# Patient Record
Sex: Female | Born: 1945 | Marital: Single | Smoking: Never smoker
Health system: Western US, Academic
[De-identification: ages and names within clinical notes are randomized; demographics above are authoritative.]

## PROBLEM LIST (undated history)

## (undated) DIAGNOSIS — N3941 Urge incontinence: Secondary | ICD-10-CM

## (undated) DIAGNOSIS — I8393 Asymptomatic varicose veins of bilateral lower extremities: Secondary | ICD-10-CM

## (undated) DIAGNOSIS — I82409 Acute embolism and thrombosis of unspecified deep veins of unspecified lower extremity: Secondary | ICD-10-CM

## (undated) DIAGNOSIS — E039 Hypothyroidism, unspecified: Secondary | ICD-10-CM

## (undated) DIAGNOSIS — D0461 Carcinoma in situ of skin of right upper limb, including shoulder: Secondary | ICD-10-CM

## (undated) DIAGNOSIS — N952 Postmenopausal atrophic vaginitis: Secondary | ICD-10-CM

## (undated) DIAGNOSIS — H9192 Unspecified hearing loss, left ear: Secondary | ICD-10-CM

## (undated) DIAGNOSIS — K644 Residual hemorrhoidal skin tags: Secondary | ICD-10-CM

## (undated) DIAGNOSIS — M1712 Unilateral primary osteoarthritis, left knee: Secondary | ICD-10-CM

## (undated) DIAGNOSIS — H3553 Other dystrophies primarily involving the sensory retina: Secondary | ICD-10-CM

## (undated) DIAGNOSIS — E785 Hyperlipidemia, unspecified: Secondary | ICD-10-CM

## (undated) DIAGNOSIS — H8110 Benign paroxysmal vertigo, unspecified ear: Secondary | ICD-10-CM

## (undated) DIAGNOSIS — Z86711 Personal history of pulmonary embolism: Secondary | ICD-10-CM

## (undated) DIAGNOSIS — H548 Legal blindness, as defined in USA: Secondary | ICD-10-CM

## (undated) HISTORY — DX: Other dystrophies primarily involving the sensory retina: H35.53

## (undated) HISTORY — DX: Hypothyroidism, unspecified: E03.9

## (undated) HISTORY — DX: Postmenopausal atrophic vaginitis: N95.2

## (undated) HISTORY — DX: Acute embolism and thrombosis of unspecified deep veins of unspecified lower extremity: I82.409

## (undated) HISTORY — DX: Unspecified hearing loss, left ear: H91.92

## (undated) HISTORY — DX: Residual hemorrhoidal skin tags: K64.4

## (undated) HISTORY — DX: Urge incontinence: N39.41

## (undated) HISTORY — DX: Unilateral primary osteoarthritis, left knee: M17.12

## (undated) HISTORY — DX: Asymptomatic varicose veins of bilateral lower extremities: I83.93

## (undated) HISTORY — DX: Carcinoma in situ of skin of right upper limb, including shoulder: D04.61

## (undated) HISTORY — DX: Benign paroxysmal vertigo, unspecified ear: H81.10

## (undated) HISTORY — DX: Hyperlipidemia, unspecified: E78.5

## (undated) HISTORY — PX: APPENDECTOMY: SHX54

## (undated) HISTORY — PX: BACK SURGERY: SHX140

## (undated) HISTORY — PX: MENISCECTOMY: SHX123

## (undated) NOTE — Telephone Encounter (Signed)
Formatting of this note might be different from the original.  Images from the original note were not included.  Referral was declined by [employer]. Advise how you would like to proceed.     Candelaria Stagers.   [facility] Referral Management Specialist  Phone: [phone]  Electronically signed by Kandis Cocking at 10/03/2021  6:50 AM PDT

## (undated) NOTE — Telephone Encounter (Signed)
Formatting of this note might be different from the original.  Called patient and left voicemail requesting a call back.     PSC rep: please route caller back to the [facility] Triage Line when they call back. Thank you.    Electronically signed by Salome Holmes, RN at 10/03/2021 12:06 PM PDT

## (undated) NOTE — Telephone Encounter (Signed)
Formatting of this note might be different from the original.  Has not been redirected please assist   Electronically signed by Shirley Muscat, MA at 10/02/2021  9:43 AM PDT

## (undated) NOTE — Telephone Encounter (Signed)
Formatting of this note might be different from the original.  Message sent to [facility] Ortho schedulers to assist w/ redirecting referral internally.     Candelaria Stagers.   SMF Referral Management Specialist  Phone: [phone]  Electronically signed by Kandis Cocking at 09/05/2021  8:52 AM PDT

## (undated) NOTE — Telephone Encounter (Signed)
Formatting of this note might be different from the original.  ENCOUNTER UPDATE    SPECIFIC ACTION/QUESTION REQUESTED: (What/When/Why?: Patient returned call, transferring to nurse line.)  CALLER'S NAME/RELATION: Self  PHONE NUMBER: [phone]  MESSAGE RELAYED? not applicable    Electronically signed by Nada Maclachlan E at 10/09/2021  8:49 AM PDT

## (undated) NOTE — Telephone Encounter (Signed)
Formatting of this note might be different from the original.  Relayed message to patient.   She verbalized understanding.   Electronically signed by Salome Holmes, RN at 10/09/2021  8:56 AM PDT

## (undated) NOTE — Telephone Encounter (Signed)
Formatting of this note might be different from the original.  Please call patient. She has been seen by Dr. Hale Bogus 07/11/2021. I recommend that schedule a follow up appointment with him as it looks like referral to and ortho in [place] have been declined. She can ask Dr. Hale Bogus for referral for a second opinion. It might be authorized if it comes from him. Thank you.   Electronically signed by Elicia Lamp, MD at 10/03/2021 11:44 AM PDT

## (undated) NOTE — Telephone Encounter (Signed)
Formatting of this note might be different from the original.  Called patient and left voicemail requesting a call back.     PSC rep: please route caller back to the [facility] Triage Line when they call back. Thank you.    Electronically signed by Salome Holmes, RN at 10/08/2021  2:02 PM PDT

## (undated) NOTE — Telephone Encounter (Signed)
Formatting of this note might be different from the original.  Routing message to PCP's office for review     Vannessa Z.   SM[facility] Referral Management Specialist  Phone: [phone]  Electronically signed by Kandis Cocking at 09/04/2021  9:06 AM PDT

## (undated) NOTE — Telephone Encounter (Signed)
Formatting of this note might be different from the original.  Patient states she was told by [place] to call back in 6 months   She is willing to travel for sooner appointment whether second opinion with [facility] [place] or [employer] to lvm for pt   Electronically signed by Shirley Muscat, MA at 09/04/2021  9:43 AM PDT

## (undated) NOTE — Telephone Encounter (Signed)
Formatting of this note might be different from the original.  Received message from Ortho that they are unable to accommodate at this time. I am not sure which Ortho office this is. Please assist in redirecting where appropriate internally with [facility].   Electronically signed by Ellin Saba, MD at 09/19/2021 12:23 AM PDT

## (undated) NOTE — Telephone Encounter (Signed)
Formatting of this note might be different from the original.  Please ask referrals to redirect ortho referral from 07/25/2021 to [facility]. Thank you.  Electronically signed by Elicia Lamp, MD at 09/04/2021  9:45 AM PDT

## (undated) NOTE — Telephone Encounter (Signed)
Formatting of this note might be different from the original.  Please follow up on this as I can't tell that it has been addressed.   Electronically signed by Ellin Saba, MD at 09/30/2021  9:17 PM PDT

## (undated) NOTE — Telephone Encounter (Signed)
Formatting of this note might be different from the original.  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  Yes    Specialty Requested (if known):  [Orthopedic Surgeon]  Provider Requested (full name):  N/A                      Phone:  N/A        Address:  [address] [place]]    Additional Comments: PCP sent referral to [facility] Department of Orthopedic Surgery) Patient was informed to call back in 6 months. Patient stated she cant wait that long to get assistance. Patient would like to get a return call about this referral and preference of physician. Please contact patient as soon as possible.    Appointment Scheduled: NO    Respond to Patient By:  Telephone Call [phone]    Electronically signed by Chaney Born at 09/03/2021  4:52 PM PDT

---

## 1970-05-22 HISTORY — PX: APPENDECTOMY: SHX000030

## 2000-03-23 DIAGNOSIS — I2699 Other pulmonary embolism without acute cor pulmonale: Secondary | ICD-10-CM

## 2000-03-23 HISTORY — PX: PR REMOVE PUL ART EMBOLI W CP BYPASS: 33910

## 2000-03-23 HISTORY — DX: Other pulmonary embolism without acute cor pulmonale: I26.99

## 2000-03-23 HISTORY — PX: OTHER SURGICAL HISTORY: SHX170

## 2011-03-24 HISTORY — PX: OTHER SURGICAL HISTORY: SHX170

## 2012-07-11 ENCOUNTER — Ambulatory Visit: Payer: MEDICARE

## 2012-07-12 ENCOUNTER — Encounter: Payer: Self-pay | Admitting: Ophthalmology

## 2012-08-08 ENCOUNTER — Ambulatory Visit: Payer: Self-pay

## 2012-08-11 ENCOUNTER — Ambulatory Visit: Admitting: Ophthalmology

## 2012-08-11 NOTE — Procedures (Signed)
Macula OCT and Fundus Photos OU performed. Please review.  CMT OD: 287 microns.  CMT OS: 286 microns.    Saide Lanuza, CRA.

## 2012-08-11 NOTE — Progress Notes (Addendum)
[facility]  VITREORETINAL SERVICE  NEW PATIENT ATTENDING NOTE    Referring doctor: self    Lori Lewis is a 67yr old female who presents for evaluation of poor vision in both eyes starting at age 67 or so when she failed a school eye exam. She was taken to her local eye doctor and was eventually diagnosed with Stargardt disease at [facility].     Past Ocular History:  Stargardt disease    Current Eye Medications:  none    Pertinent Past Medical History:  hypothyroidism    Family History:  No family history of age-related macular degeneration, retinal tears or detachment.  No affected family members, 3 sibs, 2 daughters, none affected.  Social History:  Denies smoking  Works     See exam template    Macula OCT and Fundus Photos OU performed 08/11/2012   CMT OD: 287 microns. Thinning and atrophy, foveal contour preserved, no fluid  CMT OS: 286 microns. Thinning and atrophy, foveal contour preserved, no fluid    Fundus photos 08/11/2012 show foveal hyperpigmentation and diffuse macular atrophy in both eyes.    Impression:  1. End stage Stargardt disease   - s/p experimental laser treatments at [facility] in early 1970's in the left eye only.   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 Dry eye syndrome    3. Cataracts   - observe      Plan:    - cautious observation  - warm compresses/ATs  - Follow-up in 1 year ocular coherence tomography OU    Chelsea Pedretti, MD PhD  [facility]   Retina Attending

## 2012-08-11 NOTE — Progress Notes (Signed)
POHx- macular degeneration (Stargard's Ds) since 1959                Laser for macular degeration 1971 in [place]

## 2013-02-23 HISTORY — PX: COLONOSCOPY: GILAB00002

## 2017-03-07 ENCOUNTER — Encounter: Payer: Self-pay | Admitting: Emergency Medicine

## 2017-03-07 ENCOUNTER — Other Ambulatory Visit: Payer: Self-pay

## 2017-03-07 ENCOUNTER — Observation Stay
Admission: EM | Admit: 2017-03-07 | Discharge: 2017-03-10 | Disposition: A | Discharge status: Home / Self Care | Payer: Medicare Other | Attending: Internal Medicine | Admitting: Internal Medicine

## 2017-03-07 ENCOUNTER — Observation Stay: Payer: Medicare Other

## 2017-03-07 DIAGNOSIS — R42 Dizziness and giddiness: Secondary | ICD-10-CM | POA: Diagnosis not present

## 2017-03-07 DIAGNOSIS — R112 Nausea with vomiting, unspecified: Secondary | ICD-10-CM

## 2017-03-07 DIAGNOSIS — R51 Headache: Secondary | ICD-10-CM | POA: Insufficient documentation

## 2017-03-07 DIAGNOSIS — E039 Hypothyroidism, unspecified: Secondary | ICD-10-CM | POA: Diagnosis not present

## 2017-03-07 DIAGNOSIS — R531 Weakness: Secondary | ICD-10-CM | POA: Diagnosis present

## 2017-03-07 DIAGNOSIS — Z79899 Other long term (current) drug therapy: Secondary | ICD-10-CM | POA: Diagnosis not present

## 2017-03-07 DIAGNOSIS — Z880 Allergy status to penicillin: Secondary | ICD-10-CM | POA: Insufficient documentation

## 2017-03-07 DIAGNOSIS — E876 Hypokalemia: Secondary | ICD-10-CM | POA: Diagnosis present

## 2017-03-07 DIAGNOSIS — Z86711 Personal history of pulmonary embolism: Secondary | ICD-10-CM | POA: Diagnosis not present

## 2017-03-07 DIAGNOSIS — R2689 Other abnormalities of gait and mobility: Secondary | ICD-10-CM | POA: Insufficient documentation

## 2017-03-07 DIAGNOSIS — Z7901 Long term (current) use of anticoagulants: Secondary | ICD-10-CM | POA: Diagnosis not present

## 2017-03-07 DIAGNOSIS — H548 Legal blindness, as defined in USA: Secondary | ICD-10-CM | POA: Diagnosis not present

## 2017-03-07 HISTORY — DX: Legal blindness, as defined in USA: H54.8

## 2017-03-07 HISTORY — DX: Personal history of pulmonary embolism: Z86.711

## 2017-03-07 HISTORY — DX: Hypothyroidism, unspecified: E03.9

## 2017-03-07 LAB — URINALYSIS, COMPLETE (UACMP) WITH MICROSCOPIC
BILIRUBIN URINE: NEGATIVE
Bacteria, UA: NONE SEEN
GLUCOSE, UA: NEGATIVE mg/dL
KETONES UR: NEGATIVE mg/dL
LEUKOCYTES UA: NEGATIVE
NITRITE: NEGATIVE
PH: 7 (ref 5.0–8.0)
PROTEIN: NEGATIVE mg/dL
Specific Gravity, Urine: 1.01 (ref 1.005–1.030)

## 2017-03-07 LAB — COMPREHENSIVE METABOLIC PANEL
ALBUMIN: 3.8 g/dL (ref 3.5–5.0)
ALT: 16 U/L (ref 14–54)
ANION GAP: 11 (ref 5–15)
AST: 30 U/L (ref 15–41)
Alkaline Phosphatase: 68 U/L (ref 38–126)
BUN: 13 mg/dL (ref 6–20)
CHLORIDE: 103 mmol/L (ref 101–111)
CO2: 26 mmol/L (ref 22–32)
Calcium: 8.8 mg/dL — ABNORMAL LOW (ref 8.9–10.3)
Creatinine, Ser: 0.65 mg/dL (ref 0.44–1.00)
GFR calc Af Amer: >60 mL/min (ref >60)
GLUCOSE: 134 mg/dL — AB (ref 65–99)
POTASSIUM: 3.1 mmol/L — AB (ref 3.5–5.1)
Sodium: 140 mmol/L (ref 135–145)
Total Bilirubin: 0.7 mg/dL (ref 0.3–1.2)
Total Protein: 6.5 g/dL (ref 6.5–8.1)

## 2017-03-07 LAB — CBC WITH DIFFERENTIAL/PLATELET
BASOS ABS: 0 10*3/uL (ref 0–0.1)
Basophils Relative: 0 %
EOS ABS: 0.1 10*3/uL (ref 0–0.7)
Eosinophils Relative: 1 %
HCT: 42.6 % (ref 35.0–47.0)
HEMOGLOBIN: 14.1 g/dL (ref 12.0–16.0)
LYMPHS ABS: 1.3 10*3/uL (ref 1.0–3.6)
Lymphocytes Relative: 12 %
MCH: 29.1 pg (ref 26.0–34.0)
MCHC: 33.1 g/dL (ref 32.0–36.0)
MCV: 87.8 fL (ref 80.0–100.0)
Monocytes Absolute: 0.5 10*3/uL (ref 0.2–0.9)
Monocytes Relative: 5 %
NEUTROS PCT: 82 %
Neutro Abs: 8.8 10*3/uL — ABNORMAL HIGH (ref 1.4–6.5)
PLATELETS: 201 10*3/uL (ref 150–440)
RBC: 4.86 MIL/uL (ref 3.80–5.20)
RDW: 13.2 % (ref 11.5–14.5)
WBC: 10.8 10*3/uL (ref 3.6–11.0)

## 2017-03-07 LAB — TROPONIN I

## 2017-03-07 LAB — LACTIC ACID, PLASMA
LACTIC ACID, VENOUS: 1.3 mmol/L (ref 0.5–1.9)
Lactic Acid, Venous: 2 mmol/L (ref 0.5–1.9)

## 2017-03-07 LAB — MAGNESIUM: Magnesium: 1.9 mg/dL (ref 1.7–2.4)

## 2017-03-07 LAB — TSH: TSH: 0.836 u[IU]/mL (ref 0.350–4.500)

## 2017-03-07 LAB — PHOSPHORUS: PHOSPHORUS: 3.2 mg/dL (ref 2.5–4.6)

## 2017-03-07 MED ORDER — ACETAMINOPHEN 500 MG PO TABS
1000.0000 mg | ORAL_TABLET | Freq: Once | ORAL | Status: AC
  Administered 2017-03-07: 1000 mg via ORAL
  Filled 2017-03-07: qty 2

## 2017-03-07 MED ORDER — ONDANSETRON HCL 4 MG PO TABS: 4.0000 mg | ORAL_TABLET | Freq: Three times a day (TID) | ORAL | 0 refills | Status: DC | PRN

## 2017-03-07 MED ORDER — ACETAMINOPHEN 650 MG RE SUPP: 650.0000 mg | Freq: Four times a day (QID) | RECTAL | Status: DC | PRN

## 2017-03-07 MED ORDER — POTASSIUM CHLORIDE CRYS ER 20 MEQ PO TBCR
40.0000 meq | EXTENDED_RELEASE_TABLET | Freq: Once | ORAL | Status: AC
  Administered 2017-03-07: 40 meq via ORAL
  Filled 2017-03-07: qty 2

## 2017-03-07 MED ORDER — MECLIZINE HCL 25 MG PO TABS
25.0000 mg | ORAL_TABLET | Freq: Three times a day (TID) | ORAL | Status: DC | PRN
  Administered 2017-03-08: 25 mg via ORAL
  Filled 2017-03-07 (×2): qty 1

## 2017-03-07 MED ORDER — ENOXAPARIN SODIUM 40 MG/0.4ML ~~LOC~~ SOLN
40.0000 mg | SUBCUTANEOUS | Status: DC
  Administered 2017-03-07 – 2017-03-09 (×3): 40 mg via SUBCUTANEOUS
  Filled 2017-03-07 (×3): qty 0.4

## 2017-03-07 MED ORDER — SODIUM CHLORIDE 0.9 % IV BOLUS (SEPSIS)
1000.0000 mL | Freq: Once | INTRAVENOUS | Status: AC
  Administered 2017-03-07: 1000 mL via INTRAVENOUS

## 2017-03-07 MED ORDER — LORAZEPAM 2 MG/ML IJ SOLN
2.0000 mg | Freq: Once | INTRAMUSCULAR | Status: AC | PRN
  Administered 2017-03-08: 2 mg via INTRAVENOUS
  Filled 2017-03-07: qty 1

## 2017-03-07 MED ORDER — ONDANSETRON HCL 4 MG/2ML IJ SOLN
4.0000 mg | Freq: Once | INTRAMUSCULAR | Status: AC
  Administered 2017-03-07: 4 mg via INTRAVENOUS
  Filled 2017-03-07: qty 2

## 2017-03-07 MED ORDER — ACETAMINOPHEN 325 MG PO TABS
650.0000 mg | ORAL_TABLET | Freq: Four times a day (QID) | ORAL | Status: DC | PRN
  Administered 2017-03-08 – 2017-03-10 (×5): 650 mg via ORAL
  Filled 2017-03-07 (×5): qty 2

## 2017-03-07 MED ORDER — POTASSIUM CHLORIDE IN NACL 20-0.9 MEQ/L-% IV SOLN
INTRAVENOUS | Status: DC
  Administered 2017-03-07 – 2017-03-09 (×4): via INTRAVENOUS
  Filled 2017-03-07 (×5): qty 1000

## 2017-03-07 MED ORDER — ONDANSETRON HCL 4 MG/2ML IJ SOLN
4.0000 mg | Freq: Four times a day (QID) | INTRAMUSCULAR | Status: AC
  Administered 2017-03-07 – 2017-03-09 (×8): 4 mg via INTRAVENOUS
  Filled 2017-03-07 (×8): qty 2

## 2017-03-07 MED ORDER — LEVOTHYROXINE SODIUM 112 MCG PO TABS
112.0000 ug | ORAL_TABLET | Freq: Every day | ORAL | Status: DC
  Administered 2017-03-08 – 2017-03-10 (×3): 112 ug via ORAL
  Filled 2017-03-07 (×3): qty 1

## 2017-03-07 MED ORDER — DIAZEPAM 2 MG PO TABS
2.0000 mg | ORAL_TABLET | Freq: Once | ORAL | Status: AC
  Administered 2017-03-07: 2 mg via ORAL
  Filled 2017-03-07: qty 1

## 2017-03-07 MED ORDER — MECLIZINE HCL 25 MG PO TABS
25.0000 mg | ORAL_TABLET | ORAL | Status: AC
  Administered 2017-03-07: 25 mg via ORAL
  Filled 2017-03-07: qty 1

## 2017-03-07 MED ORDER — PROMETHAZINE HCL 25 MG/ML IJ SOLN: 12.5000 mg | Freq: Four times a day (QID) | INTRAMUSCULAR | Status: DC | PRN

## 2017-03-07 NOTE — H&P (Signed)
[facility] [place] [facility]   PATIENT NAME: Andrea Spence    MR#:  [id number]  DATE OF BIRTH:  20-Jul-1945  DATE OF ADMISSION:  03/07/2017  PRIMARY CARE PHYSICIAN: System, Pcp Not In   REQUESTING/REFERRING PHYSICIAN: Dr. Governor Rooks  CHIEF COMPLAINT:   Chief Complaint  Patient presents with  . Emesis    HISTORY OF PRESENT ILLNESS:  Andrea Spence  is a 71 y.o. female with a known history of early-onset macular degeneration with legal blindness, hypothyroidism and remote history of pulmonary embolism presents to hospital secondary to intractable nausea, vomiting and dizziness. Patient is usually very active at baseline. She is actually traveling from [place]. Yesterday afternoon she and her friend had a tuna salad on a biscuit, did not start having her symptoms up until late yesterday. Nobody else with similar symptoms. Denies any fevers or chills. Started with intractable nausea associated with significant vomiting every 15 minutes almost. No bloody vomiting. Complains mostly of brownish and bilious vomiting. Denies any abdominal pain or diarrhea. Also started to have extensive dizziness this morning. She got to the hospital, labs were okay, IV fluids were given, her potassium was low which was replaced. She was stood up to get orthostatic vitals and her dizziness came back again and she feels debilitated. She is being admitted under observation for the same.  PAST MEDICAL HISTORY:   Past Medical History:  Diagnosis Date  . History of pulmonary embolism   . Hypothyroid   . Legal blindness     PAST SURGICAL HISTORY:   Past Surgical History:  Procedure Laterality Date  . APPENDECTOMY    . BACK SURGERY    . MENISCECTOMY Left     SOCIAL HISTORY:   Social History   Tobacco Use  . Smoking status: Never Smoker  . Smokeless tobacco: Never Used  Substance Use Topics  . Alcohol use: Yes    Comment: occasional wine    FAMILY HISTORY:    Family History  Problem Relation Age of Onset  . CAD Mother   . CAD Father   . Stroke Brother     DRUG ALLERGIES:   Allergies  Allergen Reactions  . Stinging Nettle [Urtica Dioica] Anaphylaxis  . Amoxicillin Rash    Has patient had a PCN reaction causing immediate rash, facial/tongue/throat swelling, SOB or lightheadedness with hypotension: Unknown Has patient had a PCN reaction causing severe rash involving mucus membranes or skin necrosis: Unknown Has patient had a PCN reaction that required hospitalization: Unknown Has patient had a PCN reaction occurring within the last 10 years: Unknown If all of the above answers are "NO", then may proceed with Cephalosporin use.  Marland Kitchen Penicillins Rash    Has patient had a PCN reaction causing immediate rash, facial/tongue/throat swelling, SOB or lightheadedness with hypotension: Yes Has patient had a PCN reaction causing severe rash involving mucus membranes or skin necrosis: No Has patient had a PCN reaction that required hospitalization: No Has patient had a PCN reaction occurring within the last 10 years: Unknown If all of the above answers are "NO", then may proceed with Cephalosporin use.   . Sulfa Antibiotics Rash    REVIEW OF SYSTEMS:   Review of Systems  Constitutional: Positive for malaise/fatigue. Negative for chills, fever and weight loss.  HENT: Negative for ear discharge, ear pain, hearing loss, nosebleeds and tinnitus.   Eyes: Negative for blurred vision, double vision and photophobia.  Respiratory: Negative for cough, hemoptysis, shortness of breath and wheezing.  Cardiovascular: Negative for chest pain, palpitations, orthopnea and leg swelling.  Gastrointestinal: Positive for nausea and vomiting. Negative for abdominal pain, constipation, diarrhea, heartburn and melena.  Genitourinary: Negative for dysuria, frequency, hematuria and urgency.  Musculoskeletal: Negative for back pain, myalgias and neck pain.  Skin:  Negative for rash.  Neurological: Positive for dizziness. Negative for tingling, tremors, sensory change, speech change, focal weakness and headaches.  Endo/Heme/Allergies: Does not bruise/bleed easily.  Psychiatric/Behavioral: Negative for depression.    MEDICATIONS AT HOME:   Prior to Admission medications   Medication Sig Start Date End Date Taking? Authorizing Provider  levothyroxine (SYNTHROID, LEVOTHROID) 112 MCG tablet Take 112 mcg by mouth daily. 02/25/17  Yes [provider]  ondansetron (ZOFRAN) 4 MG tablet Take 1 tablet (4 mg total) by mouth every 8 (eight) hours as needed for nausea or vomiting. 03/07/17   Governor RooksLord, Rebecca, MD      VITAL SIGNS:  Blood pressure (!) 151/77, pulse 60, temperature (!) 97.5 F (36.4 C), temperature source Oral, resp. rate 20, height 5\' 6"  (1.676 m), weight 68 kg (150 lb), SpO2 99 %.  PHYSICAL EXAMINATION:   Physical Exam  GENERAL:  71 y.o.-year-old elderly patient lying in the bed with no acute distress.  EYES: Pupils equal, round, reactive to light and accommodation. No scleral icterus. Extraocular muscles intact.  HEENT: Head atraumatic, normocephalic. Oropharynx and nasopharynx clear.  NECK:  Supple, no jugular venous distention. No thyroid enlargement, no tenderness.  LUNGS: Normal breath sounds bilaterally, no wheezing, rales,rhonchi or crepitation. No use of accessory muscles of respiration.  CARDIOVASCULAR: S1, S2 normal. No murmurs, rubs, or gallops.  ABDOMEN: Soft, nontender, nondistended. Bowel sounds present. No organomegaly or mass.  EXTREMITIES: No pedal edema, cyanosis, or clubbing.  NEUROLOGIC: Cranial nerves II through XII are intact. Muscle strength 5/5 in all extremities. Sensation intact. Gait not checked.  PSYCHIATRIC: The patient is alert and oriented x 3.  SKIN: No obvious rash, lesion, or ulcer.   LABORATORY PANEL:   CBC Recent Labs  Lab 03/07/17 1017  WBC 10.8  HGB 14.1  HCT 42.6  PLT 201    ------------------------------------------------------------------------------------------------------------------  Chemistries  Recent Labs  Lab 03/07/17 1017  NA 140  K 3.1*  CL 103  CO2 26  GLUCOSE 134*  BUN 13  CREATININE 0.65  CALCIUM 8.8*  AST 30  ALT 16  ALKPHOS 68  BILITOT 0.7   ------------------------------------------------------------------------------------------------------------------  Cardiac Enzymes Recent Labs  Lab 03/07/17 1017  TROPONINI <0.03   ------------------------------------------------------------------------------------------------------------------  RADIOLOGY:  No results found.  EKG:   Orders placed or performed during the hospital encounter of 03/07/17  . EKG 12-Lead  . EKG 12-Lead  . ED EKG  . ED EKG    IMPRESSION AND PLAN:   Herbie SaxonKaren Ybarbo  is a 71 y.o. female with a known history of early-onset macular degeneration with legal blindness, hypothyroidism and remote history of pulmonary embolism presents to hospital secondary to intractable nausea, vomiting and dizziness.  1. Nausea vomiting-sounds like acute gastroenteritis. -IV fluids, IV Protonix and started on clear liquid diet and monitor. -No abdominal pain or diarrhea. So we'll hold off on antibiotics her CT abdomen at this time.  2. Dizziness-could be from dehydration. -However will get a CT head to rule out any intracranial causes. -Meclizine when necessary. If needed, will also add Valium. -Try to ambulate tomorrow.  3. Hypokalemia-being replaced. Secondary to GI losses. Check magnesium level  4. Hypothyroidism-continue Synthroid. Check TSH  5. DVT prophylaxis on Lovenox  All the records are reviewed and case discussed with ED provider. Management plans discussed with the patient, family and they are in agreement.  CODE STATUS: Full code  TOTAL TIME TAKING CARE OF THIS PATIENT: 50 minutes.    Enid BaasKALISETTI,Olly Shiner M.D on 03/07/2017 at 3:13 PM  Between 7am  to 6pm - Pager - [phone]  After 6pm go to [email or web address] - password [email or web address]  [facility]  Office  [phone]  CC: Primary care physician; System, Pcp Not In

## 2017-03-07 NOTE — Progress Notes (Signed)
Patient still with persistent dizziness, now concern is if its central dizziness. No nystagmus noted on exam. Since there than nausea/vomiting- no other signs of GI illness seen- so it could be central cause of dizziness with nausea/vomiting. CT head results discussed with patient. Meclizine prn and 1 dose of valium ordered. MRI brain ordered- patient wants to do it in AM- ativan ordered. If positive- please consult neurology

## 2017-03-07 NOTE — ED Notes (Signed)
AAOx3.  Skin warm and dry.  NAD 

## 2017-03-07 NOTE — ED Provider Notes (Signed)
[facility] Emergency Department Provider Note ____________________________________________   I have reviewed the triage vital signs and the triage nursing note.  HISTORY  Chief Complaint Emesis   Historian Patient, patient's friend with whom she is staying here, as well as her daughter by phone  HPI Andrea Spence is a 71 y.o. female who is been relatively healthy, is on a multi leg travel trip, and had been feeling fine yesterday had the same thing to eat as her friend with whom she is staying, and this morning around 9 AM she felt nauseated and had several episodes of nonbloody emesis.  No diarrhea.  No fevers.  She received nausea medication by EMS and is feeling a little better than she was prior to EMS picking her up.  On this past Monday, almost a week ago she was in [facility] [place] in sewage [place].  She had a history of treated malaria years ago.  She has a history of previous blood clots, but reports no shortness of breath, chest pain, or leg edema.  She is no longer on anticoagulation other than aspirin.   Past Medical History:  Diagnosis Date  . Hypothyroid     There are no active problems to display for this patient.   Past Surgical History:  Procedure Laterality Date  . APPENDECTOMY      Prior to Admission medications   Medication Sig Start Date End Date Taking? Authorizing Provider  ondansetron (ZOFRAN) 4 MG tablet Take 1 tablet (4 mg total) by mouth every 8 (eight) hours as needed for nausea or vomiting. 03/07/17   Governor Rooks, MD    Allergies  Allergen Reactions  . Amoxicillin   . Penicillins   . Sulfa Antibiotics     No family history on file.  Social History Social History   Tobacco Use  . Smoking status: Never Smoker  . Smokeless tobacco: Never Used  Substance Use Topics  . Alcohol use: Yes  . Drug use: Not on file    Review of  Systems  Constitutional: Negative for fever. Eyes: Negative for visual changes. ENT: Negative for sore throat. Cardiovascular: Negative for chest pain. Respiratory: Negative for shortness of breath. Gastrointestinal: Negative for diarrhea. Genitourinary: Negative for dysuria. Musculoskeletal: Negative for back pain. Skin: Negative for rash. Neurological: Negative for headache.  ____________________________________________   PHYSICAL EXAM:  VITAL SIGNS: ED Triage Vitals  Enc Vitals Group     BP 03/07/17 0936 (!) 151/77     Pulse Rate 03/07/17 0936 60     Resp 03/07/17 0936 20     Temp 03/07/17 0945 (!) 97.5 F (36.4 C)     Temp Source 03/07/17 0945 Oral     SpO2 03/07/17 0936 99 %     Weight 03/07/17 0936 150 lb (68 kg)     Height 03/07/17 0936 5\' 6"  (1.676 m)     Head Circumference --      Peak Flow --      Pain Score 03/07/17 0935 0     Pain Loc --      Pain Edu? --      Excl. in GC? --      Constitutional: Alert and oriented.  She looks puny like she does not feel well, but she is able to answer some questions and is alert. HEENT   Head: Normocephalic and atraumatic.      Eyes: Conjunctivae are normal. Pupils equal and round.  Ears:         Nose: No congestion/rhinnorhea.   Mouth/Throat: Mucous membranes are moderately dry.   Neck: No stridor. Cardiovascular/Chest: Normal rate, regular rhythm.  No murmurs, rubs, or gallops. Respiratory: Normal respiratory effort without tachypnea nor retractions. Breath sounds are clear and equal bilaterally. No wheezes/rales/rhonchi. Gastrointestinal: Soft. No distention, no guarding, no rebound. Nontender.    Genitourinary/rectal:Deferred Musculoskeletal: Nontender with normal range of motion in all extremities. No joint effusions.  No lower extremity tenderness.  No edema. Neurologic:  Normal speech and language. No gross or focal neurologic deficits are appreciated. Skin:  Skin is warm, dry and intact. No rash  noted. Psychiatric: Mood and affect are normal. Speech and behavior are normal. Patient exhibits appropriate insight and judgment.   ____________________________________________  LABS (pertinent positives/negatives) I, Governor Rooksebecca Karrina Lye, MD the attending physician have reviewed the labs noted below.  Labs Reviewed  COMPREHENSIVE METABOLIC PANEL - Abnormal; Notable for the following components:      Result Value   Potassium 3.1 (*)    Glucose, Bld 134 (*)    Calcium 8.8 (*)    All other components within normal limits  LACTIC ACID, PLASMA - Abnormal; Notable for the following components:   Lactic Acid, Venous 2.0 (*)    All other components within normal limits  CBC WITH DIFFERENTIAL/PLATELET - Abnormal; Notable for the following components:   Neutro Abs 8.8 (*)    All other components within normal limits  URINALYSIS, COMPLETE (UACMP) WITH MICROSCOPIC - Abnormal; Notable for the following components:   Color, Urine YELLOW (*)    APPearance CLEAR (*)    Hgb urine dipstick SMALL (*)    Squamous Epithelial / LPF 0-5 (*)    All other components within normal limits  CULTURE, BLOOD (ROUTINE X 2)  CULTURE, BLOOD (ROUTINE X 2)  TROPONIN I  LACTIC ACID, PLASMA    ____________________________________________    EKG I, Governor Rooksebecca Sharnette Kitamura, MD, the attending physician have personally viewed and interpreted all ECGs.  66 bpm.  Normal sinus rhythm.  Narrow QRS.  Normal axis.  Nonspecific T wave ____________________________________________  RADIOLOGY All Xrays were viewed by me.  Imaging interpreted by Radiologist, and I, Governor Rooksebecca Dawnisha Marquina, MD the attending physician have reviewed the radiologist interpretation noted below.  None __________________________________________  PROCEDURES  Procedure(s) performed: None  Critical Care performed: None   ____________________________________________  ED COURSE / ASSESSMENT AND PLAN  Pertinent labs & imaging results that were available during my  care of the patient were reviewed by me and considered in my medical decision making (see chart for details).   Patient woke up with nausea and vomiting this morning without additional associated symptoms such as chest pain or syncope, or diarrhea or fevers.  Norovirus/gi viruses are common in the community right now, however she has not had any diarrhea at this point.  Daughter by phone give some additional history about her past, but at this point seems like she is not having clinical symptoms that would make me highly concerned that her previous PE or malaria were contributory to this episode, and I think a viral GI illness seems much more likely than exposures from nearly 1 week ago in [place], however would consider this moving forward.   Patient feeling little better after IV fluid bolus here.  Lactate was initially slightly elevated on repeat was coming down.  Her white blood cell count and total was not elevated, however she did have a left shift.  No evidence of  urinary tract infection.  Again she is not having respiratory symptoms.  Blood cultures were sent.  Patient states that she was able to walk up to the bathroom and just felt a little bit weak.  I would have her do orthostatics and try p.o. challenge.  We discussed whether or not to observe overnight given the fact that she still feels fairly weak all over, but her blood pressure and heart rate/vital signs have been stable here.  Evaluation around 240, patient is unable to stand up without feeling dizzy and so weak that she may pass out.  Her blood pressure did not drop, or heart rate is, however she is really too weak to go home as it is right now.  Discussed with hospitalist for admission.  CONSULTATIONS:   Dr. Nemiah CommanderKalisetti, hospitalist for admission.   Patient / Family / Caregiver informed of clinical course, medical decision-making process, and agree with plan.    ___________________________________________   FINAL  CLINICAL IMPRESSION(S) / ED DIAGNOSES   Final diagnoses:  Hypokalemia  Intractable vomiting with nausea, unspecified vomiting type  Generalized weakness      ___________________________________________        Note: This dictation was prepared with Dragon dictation. Any transcriptional errors that result from this process are unintentional    Governor RooksLord, Woodford Strege, MD 03/07/17 1442

## 2017-03-07 NOTE — ED Notes (Signed)
Patient tolerated PO well and was able to ambulated to the toilet with minimal assistance.  During orthostatic VS, patient dizziness returned and patient states she felt unwell again.  Will alert Dr. Shaune PollackLord.

## 2017-03-07 NOTE — ED Notes (Signed)
Patient denies pain and is resting comfortably.  

## 2017-03-07 NOTE — ED Triage Notes (Signed)
Ems pt , with sudden onset of vomiting , pale, diaphoretic, recent travels throughout the [place], " a lot of people sick on the plane"  zofran given PTA.

## 2017-03-08 ENCOUNTER — Observation Stay: Payer: Medicare Other

## 2017-03-08 DIAGNOSIS — E876 Hypokalemia: Secondary | ICD-10-CM | POA: Diagnosis not present

## 2017-03-08 LAB — CBC
HCT: 40 % (ref 35.0–47.0)
Hemoglobin: 13.3 g/dL (ref 12.0–16.0)
MCH: 29.1 pg (ref 26.0–34.0)
MCHC: 33.2 g/dL (ref 32.0–36.0)
MCV: 87.5 fL (ref 80.0–100.0)
PLATELETS: 189 10*3/uL (ref 150–440)
RBC: 4.57 MIL/uL (ref 3.80–5.20)
RDW: 13.2 % (ref 11.5–14.5)
WBC: 7.4 10*3/uL (ref 3.6–11.0)

## 2017-03-08 LAB — BASIC METABOLIC PANEL
Anion gap: 6 (ref 5–15)
BUN: 12 mg/dL (ref 6–20)
CALCIUM: 8.7 mg/dL — AB (ref 8.9–10.3)
CO2: 23 mmol/L (ref 22–32)
CREATININE: 0.68 mg/dL (ref 0.44–1.00)
Chloride: 113 mmol/L — ABNORMAL HIGH (ref 101–111)
GFR calc Af Amer: >60 mL/min (ref >60)
GLUCOSE: 101 mg/dL — AB (ref 65–99)
Potassium: 3.7 mmol/L (ref 3.5–5.1)
Sodium: 142 mmol/L (ref 135–145)

## 2017-03-08 MED ORDER — MECLIZINE HCL 25 MG PO TABS
25.0000 mg | ORAL_TABLET | Freq: Three times a day (TID) | ORAL | Status: DC
  Administered 2017-03-08 – 2017-03-10 (×6): 25 mg via ORAL
  Filled 2017-03-08 (×8): qty 1

## 2017-03-08 MED ORDER — DIAZEPAM 2 MG PO TABS
2.0000 mg | ORAL_TABLET | Freq: Four times a day (QID) | ORAL | Status: DC | PRN
  Administered 2017-03-09: 2 mg via ORAL
  Filled 2017-03-08: qty 1

## 2017-03-08 NOTE — Progress Notes (Signed)
[employer] - [place] at [facility]                                                                                                                                                                                  Patient Demographics   Andrea Spence, is a 71 y.o. female, DOB - [birth year], OZH:[id number]  Admit date - 03/07/2017   Admitting Physician Enid Baas, MD  Outpatient Primary MD for the patient is System, Pcp Not In   LOS - 0  Subjective: Patient continues to be very dizzy and symptomatic     Review of Systems:   CONSTITUTIONAL: No documented fever. No fatigue, weakness. No weight gain, no weight loss.  EYES: No blurry or double vision.  ENT: No tinnitus. No postnasal drip. No redness of the oropharynx.  RESPIRATORY: No cough, no wheeze, no hemoptysis. No dyspnea.  CARDIOVASCULAR: No chest pain. No orthopnea. No palpitations. No syncope.  GASTROINTESTINAL: No nausea, no vomiting or diarrhea. No abdominal pain. No melena or hematochezia.  GENITOURINARY: No dysuria or hematuria.  ENDOCRINE: No polyuria or nocturia. No heat or cold intolerance.  HEMATOLOGY: No anemia. No bruising. No bleeding.  INTEGUMENTARY: No rashes. No lesions.  MUSCULOSKELETAL: No arthritis. No swelling. No gout.  NEUROLOGIC: No numbness, tingling, or ataxia. No seizure-type activity.  Positive dizziness PSYCHIATRIC: No anxiety. No insomnia. No ADD.    Vitals:   Vitals:   03/07/17 1634 03/07/17 1926 03/08/17 0759 03/08/17 1653  BP: (!) 142/90 (!) 142/75 126/71 137/80  Pulse: 70 79 68 66  Resp: 18 18    Temp: 97.9 F (36.6 C) 98.7 F (37.1 C)  (!) 97.5 F (36.4 C)  TempSrc: Oral Oral  Oral  SpO2: 100% 92% 97% 96%  Weight:      Height:        Wt Readings from Last 3 Encounters:  03/07/17 150 lb (68 kg)     Intake/Output Summary (Last 24 hours) at 03/08/2017 1804 Last data filed at 03/08/2017 1719 Gross per 24 hour  Intake 1716.25 ml  Output -  Net 1716.25 ml     Physical Exam:   GENERAL: Pleasant-appearing in no apparent distress.  HEAD, EYES, EARS, NOSE AND THROAT: Atraumatic, normocephalic. Extraocular muscles are intact. Pupils equal and reactive to light. Sclerae anicteric. No conjunctival injection. No oro-pharyngeal erythema.  NECK: Supple. There is no jugular venous distention. No bruits, no lymphadenopathy, no thyromegaly.  HEART: Regular rate and rhythm,. No murmurs, no rubs, no clicks.  LUNGS: Clear to auscultation bilaterally. No rales or rhonchi. No wheezes.  ABDOMEN: Soft, flat, nontender, nondistended. Has good bowel sounds. No hepatosplenomegaly appreciated.  EXTREMITIES: No evidence of any cyanosis,  clubbing, or peripheral edema.  +2 pedal and radial pulses bilaterally.  NEUROLOGIC: The patient is alert, awake, and oriented x3 with no focal motor or sensory deficits appreciated bilaterally.  SKIN: Moist and warm with no rashes appreciated.  Psych: Not anxious, depressed LN: No inguinal LN enlargement    Antibiotics   Anti-infectives (From admission, onward)   None      Medications   Scheduled Meds: . enoxaparin (LOVENOX) injection  40 mg Subcutaneous Q24H  . levothyroxine  112 mcg Oral QAC breakfast  . meclizine  25 mg Oral TID  . ondansetron (ZOFRAN) IV  4 mg Intravenous Q6H   Continuous Infusions: . 0.9 % NaCl with KCl 20 mEq / L 75 mL/hr at 03/08/17 0651   PRN Meds:.acetaminophen **OR** acetaminophen, diazepam, promethazine   Data Review:   Micro Results Recent Results (from the past 240 hour(s))  Culture, blood (routine x 2)     Status: None (Preliminary result)   Collection Time: 03/07/17 10:17 AM  Result Value Ref Range Status   Specimen Description BLOOD LT [facility]  Final   Special Requests   Final    BOTTLES DRAWN AEROBIC AND ANAEROBIC Blood Culture adequate volume   Culture NO GROWTH 1 DAY  Final   Report Status PENDING  Incomplete  Culture, blood (routine x 2)     Status: None (Preliminary result)    Collection Time: 03/07/17 10:17 AM  Result Value Ref Range Status   Specimen Description BLOOD RT AC  Final   Special Requests   Final    BOTTLES DRAWN AEROBIC AND ANAEROBIC Blood Culture adequate volume   Culture NO GROWTH 1 DAY  Final   Report Status PENDING  Incomplete    Radiology Reports Ct Head Wo Contrast  Result Date: 03/07/2017 CLINICAL DATA:  Penis, nausea EXAM: CT HEAD WITHOUT CONTRAST TECHNIQUE: Contiguous axial images were obtained from the base of the skull through the vertex without intravenous contrast. COMPARISON:  None. FINDINGS: Brain: Mild age related volume loss. No acute intracranial abnormality. Specifically, no hemorrhage, hydrocephalus, mass lesion, acute infarction, or significant intracranial injury. Vascular: No hyperdense vessel or unexpected calcification. Skull: No acute calvarial abnormality. Sinuses/Orbits: Visualized paranasal sinuses and mastoids clear. Orbital soft tissues unremarkable. Other: None IMPRESSION: Diffuse cerebral atrophy.  No acute intracranial abnormality. Electronically Signed   By: Charlett Nose M.D.   On: 03/07/2017 15:47   Mr Brain Wo Contrast  Result Date: 03/08/2017 CLINICAL DATA:  Vertigo beginning yesterday, associated emesis. History of hypothyroidism, pulmonary embolism, blindness. EXAM: MRI HEAD WITHOUT CONTRAST TECHNIQUE: Multiplanar, multiecho pulse sequences of the brain and surrounding structures were obtained without intravenous contrast. COMPARISON:  CT HEAD March 07, 2017 FINDINGS: Multiple sequences are mildly motion degraded. BRAIN: No reduced diffusion to suggest acute ischemia. No susceptibility artifact to suggest hemorrhage. The ventricles and sulci are normal for patient's age. Patchy supratentorial white matter FLAIR T2 hyperintensities. No suspicious parenchymal signal, mass or mass effect. No abnormal extra-axial fluid collections. 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 VASCULAR: Normal  major intracranial vascular flow voids present at skull base. SKULL AND UPPER CERVICAL SPINE: No abnormal sellar expansion. No suspicious calvarial bone marrow signal. Faint bright T1 and bright T2 signal LEFT frontal calvarium most compatible with hemangioma. Craniocervical junction maintained. SINUSES/ORBITS: Trace paranasal sinus mucosal thickening without air-fluid levels. Mastoid air cells are well aerated. The included ocular globes and orbital contents are non-suspicious. OTHER: None. IMPRESSION: 1. No acute intracranial process on this motion degraded examination.  2. Involutional changes and moderate chronic small vessel ischemic disease. Electronically Signed   By: Awilda Metroourtnay  Bloomer M.D.   On: 03/08/2017 13:33     CBC Recent Labs  Lab 03/07/17 1017 03/08/17 0404  WBC 10.8 7.4  HGB 14.1 13.3  HCT 42.6 40.0  PLT 201 189  MCV 87.8 87.5  MCH 29.1 29.1  MCHC 33.1 33.2  RDW 13.2 13.2  LYMPHSABS 1.3  --   MONOABS 0.5  --   EOSABS 0.1  --   BASOSABS 0.0  --     Chemistries  Recent Labs  Lab 03/07/17 1017 03/08/17 0404  NA 140 142  K 3.1* 3.7  CL 103 113*  CO2 26 23  GLUCOSE 134* 101*  BUN 13 12  CREATININE 0.65 0.68  CALCIUM 8.8* 8.7*  MG 1.9  --   AST 30  --   ALT 16  --   ALKPHOS 68  --   BILITOT 0.7  --    ------------------------------------------------------------------------------------------------------------------ estimated creatinine clearance is 58.6 mL/min (by C-G formula based on SCr of 0.68 mg/dL). ------------------------------------------------------------------------------------------------------------------ No results for input(s): HGBA1C in the last 72 hours. ------------------------------------------------------------------------------------------------------------------ No results for input(s): CHOL, HDL, LDLCALC, TRIG, CHOLHDL, LDLDIRECT in the last 72  hours. ------------------------------------------------------------------------------------------------------------------ Recent Labs    03/07/17 1644  TSH 0.836   ------------------------------------------------------------------------------------------------------------------ No results for input(s): VITAMINB12, FOLATE, FERRITIN, TIBC, IRON, RETICCTPCT in the last 72 hours.  Coagulation profile No results for input(s): INR, PROTIME in the last 168 hours.  No results for input(s): DDIMER in the last 72 hours.  Cardiac Enzymes Recent Labs  Lab 03/07/17 1017  TROPONINI <0.03   ------------------------------------------------------------------------------------------------------------------ Invalid input(s): POCBNP    Assessment & Plan   Andrea Spence  is a 71 y.o. female with a known history of early-onset macular degeneration with legal blindness, hypothyroidism and remote history of pulmonary embolism presents to hospital secondary to intractable nausea, vomiting and dizziness.  1. Nausea vomiting-suspect related dizziness Continue supportive care 2. Dizziness-could be from dehydration. Appears to be benign MRI of the head shows no acute abnormality Patient was similar episode about a year ago She also has chronic dizziness I will change the meclizine to schedule II Will add Valium as needed for severe dizziness  3. Hypokalemia-status post replacement   4. Hypothyroidism-continue Synthroid.  TSH is normal TSH is normal  5. DVT prophylaxis on Lovenox        Code Status Orders  (From admission, onward)        Start     Ordered   03/07/17 1635  Full code  Continuous     03/07/17 1634    Code Status History    Date Active Date Inactive Code Status Order ID Comments User Context   This patient has a current code status but no historical code status.    Advance Directive Documentation     Most Recent Value  Type of Advance Directive  Healthcare Power  of Attorney, Living will  Pre-existing out of facility DNR order (yellow form or pink MOST form)  No data  "MOST" Form in Place?  No data           Consults none  DVT Prophylaxis  Lovenox  Lab Results  Component Value Date   PLT 189 03/08/2017     Time Spent in minutes   35 minutes greater than 50% of time spent in care coordination and counseling patient regarding the condition and plan of care.   Auburn BilberryPATEL, Marie Chow M.D on  03/08/2017 at 6:04 PM  Between 7am to 6pm - Pager - [phone]  After 6pm go to [email or web address] - password [email or web address] [facility]  [facility]   Office  [phone]

## 2017-03-08 NOTE — Care Management Obs Status (Signed)
MEDICARE OBSERVATION STATUS NOTIFICATION   Patient Details  Name: Andrea Spence MRN: [id number] Date of Birth: 12/13/1945   Medicare Observation Status Notification Given:  Yes    Marily MemosLisa M Benyamin Jeff, RN 03/08/2017, 1:38 PM

## 2017-03-09 DIAGNOSIS — E876 Hypokalemia: Secondary | ICD-10-CM | POA: Diagnosis not present

## 2017-03-09 NOTE — Progress Notes (Signed)
[employer] - [place] at [facility]                                                                                                                                                                                  Patient Demographics   Andrea Spence, is a 71 y.o. female, DOB - 1945-08-23, QIH:[id number]  Admit date - 03/07/2017   Admitting Physician Enid Baasadhika Kalisetti, MD  Outpatient Primary MD for the patient is System, Pcp Not In   LOS - 0  Subjective: Patient is feeling dizzy but improved compared to yesterday Also complains of headache     Review of Systems:   CONSTITUTIONAL: No documented fever. No fatigue, weakness. No weight gain, no weight loss.  EYES: No blurry or double vision.  ENT: No tinnitus. No postnasal drip. No redness of the oropharynx.  RESPIRATORY: No cough, no wheeze, no hemoptysis. No dyspnea.  CARDIOVASCULAR: No chest pain. No orthopnea. No palpitations. No syncope.  GASTROINTESTINAL: No nausea, no vomiting or diarrhea. No abdominal pain. No melena or hematochezia.  GENITOURINARY: No dysuria or hematuria.  ENDOCRINE: No polyuria or nocturia. No heat or cold intolerance.  HEMATOLOGY: No anemia. No bruising. No bleeding.  INTEGUMENTARY: No rashes. No lesions.  MUSCULOSKELETAL: No arthritis. No swelling. No gout.  NEUROLOGIC: No numbness, tingling, or ataxia. No seizure-type activity.  Positive dizziness PSYCHIATRIC: No anxiety. No insomnia. No ADD.    Vitals:   Vitals:   03/08/17 1653 03/08/17 1916 03/09/17 0335 03/09/17 0833  BP: 137/80 134/79 134/84 (!) 150/75  Pulse: 66 71 65 63  Resp:  14 19 18   Temp: (!) 97.5 F (36.4 C) 98 F (36.7 C) 98.6 F (37 C) (!) 97.5 F (36.4 C)  TempSrc: Oral Oral Oral Oral  SpO2: 96% 96% 98% 99%  Weight:      Height:        Wt Readings from Last 3 Encounters:  03/07/17 150 lb (68 kg)     Intake/Output Summary (Last 24 hours) at 03/09/2017 1505 Last data filed at 03/09/2017 1300 Gross per  24 hour  Intake 2255 ml  Output -  Net 2255 ml    Physical Exam:   GENERAL: Pleasant-appearing in no apparent distress.  HEAD, EYES, EARS, NOSE AND THROAT: Atraumatic, normocephalic. Extraocular muscles are intact. Pupils equal and reactive to light. Sclerae anicteric. No conjunctival injection. No oro-pharyngeal erythema.  NECK: Supple. There is no jugular venous distention. No bruits, no lymphadenopathy, no thyromegaly.  HEART: Regular rate and rhythm,. No murmurs, no rubs, no clicks.  LUNGS: Clear to auscultation bilaterally. No rales or rhonchi. No wheezes.  ABDOMEN: Soft, flat, nontender, nondistended. Has good bowel sounds. No hepatosplenomegaly  appreciated.  EXTREMITIES: No evidence of any cyanosis, clubbing, or peripheral edema.  +2 pedal and radial pulses bilaterally.  NEUROLOGIC: The patient is alert, awake, and oriented x3 with no focal motor or sensory deficits appreciated bilaterally.  SKIN: Moist and warm with no rashes appreciated.  Psych: Not anxious, depressed LN: No inguinal LN enlargement    Antibiotics   Anti-infectives (From admission, onward)   None      Medications   Scheduled Meds: . enoxaparin (LOVENOX) injection  40 mg Subcutaneous Q24H  . levothyroxine  112 mcg Oral QAC breakfast  . meclizine  25 mg Oral TID   Continuous Infusions:  PRN Meds:.acetaminophen **OR** acetaminophen, diazepam, promethazine   Data Review:   Micro Results Recent Results (from the past 240 hour(s))  Culture, blood (routine x 2)     Status: None (Preliminary result)   Collection Time: 03/07/17 10:17 AM  Result Value Ref Range Status   Spec[facility]  Final   Special Requests   Final    BOTTLES DRAWN AEROBIC AND ANAEROBIC Blood Culture adequate volume   Culture NO GROWTH 2 DAYS  Final   Report Status PENDING  Incomplete  Culture, blood (routine x 2)     Status: None (Preliminary result)   Collection Time: 03/07/17 10:17 AM  Result Value Ref Range  Status   Specimen Description BLOOD RT AC  Final   Special Requests   Final    BOTTLES DRAWN AEROBIC AND ANAEROBIC Blood Culture adequate volume   Culture NO GROWTH 2 DAYS  Final   Report Status PENDING  Incomplete    Radiology Reports Ct Head Wo Contrast  Result Date: 03/07/2017 CLINICAL DATA:  Penis, nausea EXAM: CT HEAD WITHOUT CONTRAST TECHNIQUE: Contiguous axial images were obtained from the base of the skull through the vertex without intravenous contrast. COMPARISON:  None. FINDINGS: Brain: Mild age related volume loss. No acute intracranial abnormality. Specifically, no hemorrhage, hydrocephalus, mass lesion, acute infarction, or significant intracranial injury. Vascular: No hyperdense vessel or unexpected calcification. Skull: No acute calvarial abnormality. Sinuses/Orbits: Visualized paranasal sinuses and mastoids clear. Orbital soft tissues unremarkable. Other: None IMPRESSION: Diffuse cerebral atrophy.  No acute intracranial abnormality. Electronically Signed   By: Charlett NoseKevin  Dover M.D.   On: 03/07/2017 15:47   Mr Brain Wo Contrast  Result Date: 03/08/2017 CLINICAL DATA:  Vertigo beginning yesterday, associated emesis. History of hypothyroidism, pulmonary embolism, blindness. EXAM: MRI HEAD WITHOUT CONTRAST TECHNIQUE: Multiplanar, multiecho pulse sequences of the brain and surrounding structures were obtained without intravenous contrast. COMPARISON:  CT HEAD March 07, 2017 FINDINGS: Multiple sequences are mildly motion degraded. BRAIN: No reduced diffusion to suggest acute ischemia. No susceptibility artifact to suggest hemorrhage. The ventricles and sulci are normal for patient's age. Patchy supratentorial white matter FLAIR T2 hyperintensities. No suspicious parenchymal signal, mass or mass effect. No abnormal extra-axial fluid collections. 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 VASCULAR: Normal major intracranial vascular flow voids present at skull base.  SKULL AND UPPER CERVICAL SPINE: No abnormal sellar expansion. No suspicious calvarial bone marrow signal. Faint bright T1 and bright T2 signal LEFT frontal calvarium most compatible with hemangioma. Craniocervical junction maintained. SINUSES/ORBITS: Trace paranasal sinus mucosal thickening without air-fluid levels. Mastoid air cells are well aerated. The included ocular globes and orbital contents are non-suspicious. OTHER: None. IMPRESSION: 1. No acute intracranial process on this motion degraded examination. 2. Involutional changes and moderate chronic small vessel ischemic disease. Electronically Signed   By: Awilda Metroourtnay  Bloomer  M.D.   On: 03/08/2017 13:33     CBC Recent Labs  Lab 03/07/17 1017 03/08/17 0404  WBC 10.8 7.4  HGB 14.1 13.3  HCT 42.6 40.0  PLT 201 189  MCV 87.8 87.5  MCH 29.1 29.1  MCHC 33.1 33.2  RDW 13.2 13.2  LYMPHSABS 1.3  --   MONOABS 0.5  --   EOSABS 0.1  --   BASOSABS 0.0  --     Chemistries  Recent Labs  Lab 03/07/17 1017 03/08/17 0404  NA 140 142  K 3.1* 3.7  CL 103 113*  CO2 26 23  GLUCOSE 134* 101*  BUN 13 12  CREATININE 0.65 0.68  CALCIUM 8.8* 8.7*  MG 1.9  --   AST 30  --   ALT 16  --   ALKPHOS 68  --   BILITOT 0.7  --    ------------------------------------------------------------------------------------------------------------------ estimated creatinine clearance is 58.6 mL/min (by C-G formula based on SCr of 0.68 mg/dL). ------------------------------------------------------------------------------------------------------------------ No results for input(s): HGBA1C in the last 72 hours. ------------------------------------------------------------------------------------------------------------------ No results for input(s): CHOL, HDL, LDLCALC, TRIG, CHOLHDL, LDLDIRECT in the last 72 hours. ------------------------------------------------------------------------------------------------------------------ Recent Labs    03/07/17 1644  TSH  0.836   ------------------------------------------------------------------------------------------------------------------ No results for input(s): VITAMINB12, FOLATE, FERRITIN, TIBC, IRON, RETICCTPCT in the last 72 hours.  Coagulation profile No results for input(s): INR, PROTIME in the last 168 hours.  No results for input(s): DDIMER in the last 72 hours.  Cardiac Enzymes Recent Labs  Lab 03/07/17 1017  TROPONINI <0.03   ------------------------------------------------------------------------------------------------------------------ Invalid input(s): POCBNP    Assessment & Plan   Andrea Spence  is a 71 y.o. female with a known history of early-onset macular degeneration with legal blindness, hypothyroidism and remote history of pulmonary embolism presents to hospital secondary to intractable nausea, vomiting and dizziness.  1. Nausea vomiting-suspect related dizziness Continue supportive care 2. Dizziness-likely benign positional vertigo however patient not improving as rapidly Appears to be benign MRI of the head shows no acute abnormality Patient was similar episode about a year ago She also has chronic dizziness Continue meclizine and Valium I have asked ENT to see the patient Headache possibly related to BPPV. Continue Valium and meclizine   3. Hypokalemia-status post replacement   4. Hypothyroidism-continue Synthroid.  TSH is normal TSH is normal  5. DVT prophylaxis on Lovenox        Code Status Orders  (From admission, onward)        Start     Ordered   03/07/17 1635  Full code  Continuous     03/07/17 1634    Code Status History    Date Active Date Inactive Code Status Order ID Comments User Context   This patient has a current code status but no historical code status.    Advance Directive Documentation     Most Recent Value  Type of Advance Directive  Healthcare Power of Attorney, Living will  Pre-existing out of facility DNR order  (yellow form or pink MOST form)  No data  "MOST" Form in Place?  No data           Consults none  DVT Prophylaxis  Lovenox  Lab Results  Component Value Date   PLT 189 03/08/2017     Time Spent in minutes   35 minutes greater than 50% of time spent in care coordination and counseling patient regarding the condition and plan of care.   Auburn Bilberry M.D on 03/09/2017 at 3:05 PM  Between  7am to 6pm - Pager - [phone]  After 6pm go to [email or web address] - password [email or web address] [facility]  [facility] [place] [facility]   Office  [phone]

## 2017-03-09 NOTE — Evaluation (Signed)
Physical Therapy Evaluation Patient Details Name: Andrea SaxonKaren Spence MRN: [id number] DOB: 05-07-1945 Today's Date: 03/09/2017   History of Present Illness  Pt is a 71 y.o. female presenting to hospital with nausea, vomiting, and dizziness.  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  Clinical Impression  Prior to hospital admission, pt was independent (uses white cane for ambulation d/t visual impairments).  Pt lives in [place] and is currently visiting a friend in [place] (pt's daughter present and plans to initially take pt to her friends in [place] and then travel to daughter's home in [place]).  Currently pt is SBA with bed mobility, CGA to min assist with transfers, and min to mod assist to ambulate short distances in room (no assistive device).  Functional mobility/ambulation limited d/t dizziness and then feelings of "weakness".  Plan to further assess dizziness tomorrow (nursing notified).  Pt would benefit from skilled PT to address noted impairments and functional limitations (see below for any additional details).  Upon hospital discharge, recommend pt discharge with 24/7 assist and OP PT.    Follow Up Recommendations Supervision/Assistance - 24 hour;Outpatient PT    Equipment Recommendations       Recommendations for Other Services       Precautions / Restrictions Precautions Precautions: Fall Precaution Comments: Legally blind Restrictions Weight Bearing Restrictions: No      Mobility  Bed Mobility Overal bed mobility: Needs Assistance Bed Mobility: Supine to Sit;Sit to Supine     Supine to sit: Supervision;HOB elevated Sit to supine: Supervision;HOB elevated   General bed mobility comments: vc's and tactile cues for location of bed rail to utilize with bed moblity (otherwise no assist required)  Transfers Overall transfer level: Needs assistance Equipment used:  None Transfers: Sit to/from [employer] CorporationStand;Stand Pivot Transfers Sit to Stand: Min guard;Min assist Stand pivot transfers: Min guard;Min assist(toilet transfer in bathroom)       General transfer comment: CGA to min assist to steady with transfers; vc's and tactile cues for finding hand placement for transfers  Ambulation/Gait Ambulation/Gait assistance: Min assist;Mod assist Ambulation Distance (Feet): (15 feet x2 (bed to bathroom and back)) Assistive device: None   Gait velocity: decreased   General Gait Details: decreased B step length/foot clearance/heelstrike; intermittent unsteadiness requiring assist for balance; limited d/t dizziness and c/o "weakness"  Stairs            Wheelchair Mobility    Modified Rankin (Stroke Patients Only)       Balance Overall balance assessment: Needs assistance Sitting-balance support: No upper extremity supported;Feet supported Sitting balance-Leahy Scale: Good Sitting balance - Comments: steady sitting reaching within BOS   Standing balance support: Single extremity supported Standing balance-Leahy Scale: Poor Standing balance comment: requires at least single UE support for static standing balance                             Pertinent Vitals/Pain Pain Assessment: 0-10 Pain Score: 6  Pain Location: R posterior lateral part of head near R ear Pain Descriptors / Indicators: Headache Pain Intervention(s): Limited activity within patient's tolerance;Monitored during session;Repositioned(pt declined pain meds)  Vitals (HR and O2 on room air) stable and WFL throughout treatment session.    Home Living Family/patient expects to be discharged to:: Private residence Living Arrangements: Other (Comment)(will go to friend's house in [place] and then will go to daughter's home in [place]) Available Help at Discharge: Family;Friend(s) Type  of Home: House Home Access: Stairs to enter Entrance Stairs-Rails: Right Entrance  Stairs-Number of Steps: 2 Home Layout: One level Home Equipment: Other (comment)(White cane) Additional Comments: Above house set-up friend's home in [place].    Prior Function Level of Independence: Independent with assistive device(s)         Comments: Pt denies any falls in past 6 months.  Uses white cane d/t visual impairments.     Hand Dominance        Extremity/Trunk Assessment   Upper Extremity Assessment Upper Extremity Assessment: Generalized weakness    Lower Extremity Assessment Lower Extremity Assessment: Generalized weakness    Cervical / Trunk Assessment Cervical / Trunk Assessment: Normal  Communication   Communication: No difficulties  Cognition Arousal/Alertness: Awake/alert Behavior During Therapy: WFL for tasks assessed/performed Overall Cognitive Status: Within Functional Limits for tasks assessed                                        General Comments General comments (skin integrity, edema, etc.): Pt's daughter present during session.  Nursing cleared pt for participation in physical therapy.  Pt agreeable to PT session.  Orthostatics noted to be negative in chart.    Exercises  No nystagmus noted with looking R or L.  Inconsistent possible nystagmus to L noted with head shaking nystagmus test (turning head L/R x20 times); pt only able to turn head at a very slow speed and requesting emesis bag after performing test (no emesis produced).   Assessment/Plan    PT Assessment Patient needs continued PT services  PT Problem List Decreased strength;Decreased activity tolerance;Decreased balance;Decreased mobility;Decreased knowledge of use of DME;Decreased knowledge of precautions;Pain       PT Treatment Interventions DME instruction;Gait training;Stair training;Functional mobility training;Therapeutic activities;Therapeutic exercise;Balance training;Patient/family education;Manual techniques    PT Goals (Current goals can be found  in the Care Plan section)  Acute Rehab PT Goals Patient Stated Goal: to not be dizzy PT Goal Formulation: With patient Time For Goal Achievement: 03/23/17 Potential to Achieve Goals: Fair    Frequency Min 2X/week   Barriers to discharge        Co-evaluation               AM-PAC PT "6 Clicks" Daily Activity  Outcome Measure Difficulty turning over in bed (including adjusting bedclothes, sheets and blankets)?: A Little Difficulty moving from lying on back to sitting on the side of the bed? : A Little Difficulty sitting down on and standing up from a chair with arms (e.g., wheelchair, bedside commode, etc,.)?: Unable Help needed moving to and from a bed to chair (including a wheelchair)?: A Little Help needed walking in hospital room?: A Lot Help needed climbing 3-5 steps with a railing? : A Lot 6 Click Score: 14    End of Session Equipment Utilized During Treatment: Gait belt Activity Tolerance: Other (comment)(Limited d/t dizziness and feelings of "weakness") Patient left: in bed;with call bell/phone within reach;with bed alarm set;with family/visitor present Nurse Communication: Mobility status;Precautions PT Visit Diagnosis: Other abnormalities of gait and mobility (R26.89);Muscle weakness (generalized) (M62.81);Dizziness and giddiness (R42)    Time: 1423-1500 PT Time Calculation (min) (ACUTE ONLY): 37 min   Charges:   PT Evaluation $PT Eval Low Complexity: 1 Low PT Treatments $Therapeutic Activity: 8-22 mins   PT G Codes:   PT G-Codes **NOT FOR INPATIENT CLASS** Functional Assessment Tool Used: AM-PAC 6  Clicks Basic Mobility Functional Limitation: Mobility: Walking and moving around Mobility: Walking and Moving Around Current Status [phone](G8978): At least 40 percent but less than 60 percent impaired, limited or restricted Mobility: Walking and Moving Around Goal Status [phone](G8979): 0 percent impaired, limited or restricted    Hendricks Limesmily Chimere Klingensmith, PT 03/09/17, 4:49  PM [phone]

## 2017-03-09 NOTE — Consult Note (Signed)
Herbie SaxonShender, Laycie [id number] 1946/03/04 Auburn BilberryPatel, Shreyang, MD  Reason for Consult: Dizziness  HPI: Patient recently arrived to visit from [place] had an acute episode of vertigo while standing in a friend's house in [place].  She was brought to the emergency room and admitted to the hospital. She's had significant workup including an MRI of the brain was unremarkable for acute stroke. She suddenly started on meclizine and Valium and has improved today although still feels somewhat unsteady and vertiginous when she moves her head suddenly. Her vomiting and nausea has significantly improved. She's never had any history of vertigo before she has not noticed any significant change in her hearing acutely.  Allergies:  Allergies  Allergen Reactions  . Stinging Nettle [Urtica Dioica] Anaphylaxis  . Amoxicillin Rash    Has patient had a PCN reaction causing immediate rash, facial/tongue/throat swelling, SOB or lightheadedness with hypotension: Unknown Has patient had a PCN reaction causing severe rash involving mucus membranes or skin necrosis: Unknown Has patient had a PCN reaction that required hospitalization: Unknown Has patient had a PCN reaction occurring within the last 10 years: Unknown If all of the above answers are "NO", then may proceed with Cephalosporin use.  Marland Kitchen. Penicillins Rash    Has patient had a PCN reaction causing immediate rash, facial/tongue/throat swelling, SOB or lightheadedness with hypotension: Yes Has patient had a PCN reaction causing severe rash involving mucus membranes or skin necrosis: No Has patient had a PCN reaction that required hospitalization: No Has patient had a PCN reaction occurring within the last 10 years: Unknown If all of the above answers are "NO", then may proceed with Cephalosporin use.   . Sulfa Antibiotics Rash    ROS: Review of systems normal other than 12 systems except per HPI.  PMH:  Past Medical History:  Diagnosis Date  . History of  pulmonary embolism   . Hypothyroid   . Legal blindness     FH:  Family History  Problem Relation Age of Onset  . CAD Mother   . CAD Father   . Stroke Brother     SH:  Social History   Socioeconomic History  . Marital status: Divorced    Spouse name: Not on file  . Number of children: Not on file  . Years of education: Not on file  . Highest education level: Not on file  Social Needs  . Financial resource strain: Not on file  . Food insecurity - worry: Not on file  . Food insecurity - inability: Not on file  . Transportation needs - medical: Not on file  . Transportation needs - non-medical: Not on file  Occupational History  . Not on file  Tobacco Use  . Smoking status: Never Smoker  . Smokeless tobacco: Never Used  Substance and Sexual Activity  . Alcohol use: Yes    Comment: occasional wine  . Drug use: Not on file  . Sexual activity: Not on file  Other Topics Concern  . Not on file  Social History Narrative   very active and  independent at baseline    PSH:  Past Surgical History:  Procedure Laterality Date  . APPENDECTOMY    . BACK SURGERY    . MENISCECTOMY Left     Physical  Exam: Alert and awake laying at approximate 45 angle. CN 2-12 grossly intact and symmetric. EAC/TMs normal BL. Oral cavity, lips, gums, ororpharynx normal with no masses or lesions. Skin warm and dry. Nasal cavity without polyps or purulence. External nose  and ears without masses or lesions. EOMI, PERRLA. Neck supple with no masses or lesions. No lymphadenopathy palpated. Thyroid normal with no masses.   A/P: Acute onset vertigo-certainly sounds assistive as if this is in her ear related. She has improved on the meclizine and Valium would continue that as needed. Would also recommend a consult with physical therapy for vestibular testing and rehabilitation as available. If it is not available in the hospital I have given her my card as soon as she is discharged she can come to our  office for some vestibular testing. Would recommend that she keep her head of 45 elevation would only walk with assistance she is at risk for falling. Was discharged home with meclizine 25 mg every 8 hours as needed for dizziness and Valium 5 mg every 8 hours for severe dizziness. They will set up a follow-up appointment with me in the next week upon discharge.   Lena Gores T 03/09/2017 5:01 PM

## 2017-03-10 DIAGNOSIS — R42 Dizziness and giddiness: Secondary | ICD-10-CM

## 2017-03-10 DIAGNOSIS — E876 Hypokalemia: Secondary | ICD-10-CM | POA: Diagnosis not present

## 2017-03-10 LAB — ZINC: Zinc: 61 ug/dL (ref 56–134)

## 2017-03-10 MED ORDER — DEXAMETHASONE SODIUM PHOSPHATE 10 MG/ML IJ SOLN
10.0000 mg | Freq: Once | INTRAMUSCULAR | Status: AC
  Administered 2017-03-10: 10 mg via INTRAVENOUS
  Filled 2017-03-10: qty 1

## 2017-03-10 MED ORDER — DIAZEPAM 5 MG PO TABS: 5.0000 mg | ORAL_TABLET | Freq: Three times a day (TID) | ORAL | 0 refills | Status: AC | PRN

## 2017-03-10 MED ORDER — MAGNESIUM SULFATE 2 GM/50ML IV SOLN
2.0000 g | Freq: Once | INTRAVENOUS | Status: AC
  Administered 2017-03-10: 2 g via INTRAVENOUS
  Filled 2017-03-10: qty 50

## 2017-03-10 MED ORDER — ONDANSETRON HCL 4 MG PO TABS: 4.0000 mg | ORAL_TABLET | Freq: Three times a day (TID) | ORAL | 0 refills | Status: AC | PRN

## 2017-03-10 MED ORDER — MECLIZINE HCL 25 MG PO TABS: 25.0000 mg | ORAL_TABLET | Freq: Three times a day (TID) | ORAL | 0 refills | Status: AC | PRN

## 2017-03-10 NOTE — Progress Notes (Signed)
Pt ready for d/c today per MD. Reviewed discharge instructions and prescriptions with pt and her daughter, Misty StanleyLisa, all questions answered. Follow up with ENT set for 12/20. Walker was delivered to pt's room, IV removed.   [place], Latricia HeftKorie G

## 2017-03-10 NOTE — Discharge Summary (Signed)
[facility] - [place] at [facility]  Andrea Spence, 71 y.o., DOB [birth year], MRN [id number]. Admission date: 03/07/2017 Discharge Date 03/10/2017 Primary MD System, Pcp Not In Admitting Physician Andrea Baasadhika Kalisetti, MD  Admission Diagnosis  Hypokalemia [E87.6] Generalized weakness [R53.1] Intractable vomiting with nausea, unspecified vomiting type [R11.2]  Discharge Diagnosis   Active Problems: Dizziness possibly due to vestibular neuritis Nausea or vomiting related to probable Hypothyroidism Hypokalemia Legal blindness History of pulmonary embolism    Hospital Course [name] a71 y.o.femalewith a known history of early-onset macular degeneration with legal blindness, hypothyroidism and remote history of pulmonary embolism presents to hospital secondary to intractable nausea, vomiting and dizziness. Patient underwent MRI which was negative she continued to be very symptomatic and therefore ENT consult was obtained. She was also seen by neurology due to headache. Patient was recommended to be treated medically. She'll follow up with ENT tomorrow morning. She is doing better.              Consults  ent, neurology  Significant Tests:  See full reports for all details    Ct Head Wo Contrast  Result Date: 03/07/2017 CLINICAL DATA:  Penis, nausea EXAM: CT HEAD WITHOUT CONTRAST TECHNIQUE: Contiguous axial images were obtained from the base of the skull through the vertex without intravenous contrast. COMPARISON:  None. FINDINGS: Brain: Mild age related volume loss. No acute intracranial abnormality. Specifically, no hemorrhage, hydrocephalus, mass lesion, acute infarction, or significant intracranial injury. Vascular: No hyperdense vessel or unexpected calcification. Skull: No acute calvarial abnormality. Sinuses/Orbits: Visualized paranasal sinuses and mastoids clear. Orbital soft tissues unremarkable. Other: None IMPRESSION: Diffuse cerebral atrophy.   No acute intracranial abnormality. Electronically Signed   By: Charlett NoseKevin  Dover M.D.   On: 03/07/2017 15:47   Mr Brain Wo Contrast  Result Date: 03/08/2017 CLINICAL DATA:  Vertigo beginning yesterday, associated emesis. History of hypothyroidism, pulmonary embolism, blindness. EXAM: MRI HEAD WITHOUT CONTRAST TECHNIQUE: Multiplanar, multiecho pulse sequences of the brain and surrounding structures were obtained without intravenous contrast. COMPARISON:  CT HEAD March 07, 2017 FINDINGS: Multiple sequences are mildly motion degraded. BRAIN: No reduced diffusion to suggest acute ischemia. No susceptibility artifact to suggest hemorrhage. The ventricles and sulci are normal for patient's age. Patchy supratentorial white matter FLAIR T2 hyperintensities. No suspicious parenchymal signal, mass or mass effect. No abnormal extra-axial fluid collections. 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 VASCULAR: Normal major intracranial vascular flow voids present at skull base. SKULL AND UPPER CERVICAL SPINE: No abnormal sellar expansion. No suspicious calvarial bone marrow signal. Faint bright T1 and bright T2 signal LEFT frontal calvarium most compatible with hemangioma. Craniocervical junction maintained. SINUSES/ORBITS: Trace paranasal sinus mucosal thickening without air-fluid levels. Mastoid air cells are well aerated. The included ocular globes and orbital contents are non-suspicious. OTHER: None. IMPRESSION: 1. No acute intracranial process on this motion degraded examination. 2. Involutional changes and moderate chronic small vessel ischemic disease. Electronically Signed   By: Awilda Metroourtnay  Bloomer M.D.   On: 03/08/2017 13:33       Today   Subjective:   Andrea Spence  patient feeling better denies any dizziness or syncope  Objective:   Blood pressure (!) 141/80, pulse 73, temperature 97.7 F (36.5 C), temperature source Oral, resp. rate 16, height 5\' 6"  (1.676 m), weight 150 lb  (68 kg), SpO2 97 %.  .  Intake/Output Summary (Last 24 hours) at 03/10/2017 1552 Last data filed at 03/10/2017 1413 Gross per 24 hour  Intake 240 ml  Output -  Net 240 ml    Exam VITAL SIGNS: Blood pressure (!) 141/80, pulse 73, temperature 97.7 F (36.5 C), temperature source Oral, resp. rate 16, height 5\' 6"  (1.676 m), weight 150 lb (68 kg), SpO2 97 %.  GENERAL:  71 y.o.-year-old patient lying in the bed with no acute distress.  EYES: Pupils equal, round, reactive to light and accommodation. No scleral icterus. Extraocular muscles intact.  HEENT: Head atraumatic, normocephalic. Oropharynx and nasopharynx clear.  NECK:  Supple, no jugular venous distention. No thyroid enlargement, no tenderness.  LUNGS: Normal breath sounds bilaterally, no wheezing, rales,rhonchi or crepitation. No use of accessory muscles of respiration.  CARDIOVASCULAR: S1, S2 normal. No murmurs, rubs, or gallops.  ABDOMEN: Soft, nontender, nondistended. Bowel sounds present. No organomegaly or mass.  EXTREMITIES: No pedal edema, cyanosis, or clubbing.  NEUROLOGIC: Cranial nerves II through XII are intact. Muscle strength 5/5 in all extremities. Sensation intact. Gait not checked.  PSYCHIATRIC: The patient is alert and oriented x 3.  SKIN: No obvious rash, lesion, or ulcer.   Data Review     CBC w Diff:  Lab Results  Component Value Date   WBC 7.4 03/08/2017   HGB 13.3 03/08/2017   HCT 40.0 03/08/2017   PLT 189 03/08/2017   LYMPHOPCT 12 03/07/2017   MONOPCT 5 03/07/2017   EOSPCT 1 03/07/2017   BASOPCT 0 03/07/2017   CMP:  Lab Results  Component Value Date   NA 142 03/08/2017   K 3.7 03/08/2017   CL 113 (H) 03/08/2017   CO2 23 03/08/2017   BUN 12 03/08/2017   CREATININE 0.68 03/08/2017   PROT 6.5 03/07/2017   ALBUMIN 3.8 03/07/2017   BILITOT 0.7 03/07/2017   ALKPHOS 68 03/07/2017   AST 30 03/07/2017   ALT 16 03/07/2017  .  Micro Results Recent Results (from the past 240 hour(s))   Culture, blood (routine x 2)     Status: None (Preliminary result)   Collection Time: 03/07/17 10:17 AM  Result Value Ref Range Status   Specimen Description BLOOD LT [facility]  Final   Special Requests   Final    BOTTLES DRAWN AEROBIC AND ANAEROBIC Blood Culture adequate volume   Culture NO GROWTH 3 DAYS  Final   Report Status PENDING  Incomplete  Culture, blood (routine x 2)     Status: None (Preliminary result)   Collection Time: 03/07/17 10:17 AM  Result Value Ref Range Status   Specimen Description BLOOD RT AC  Final   Special Requests   Final    BOTTLES DRAWN AEROBIC AND ANAEROBIC Blood Culture adequate volume   Culture NO GROWTH 3 DAYS  Final   Report Status PENDING  Incomplete        Code Status Orders  (From admission, onward)        Start     Ordered   03/07/17 1635  Full code  Continuous     03/07/17 1634    Code Status History    Date Active Date Inactive Code Status Order ID Comments User Context   This patient has a current code status but no historical code status.    Advance Directive Documentation     Most Recent Value  Type of Advance Directive  Healthcare Power of Attorney, Living will  Pre-existing out of facility DNR order (yellow form or pink MOST form)  No data  "MOST" Form in Place?  No data          Follow-up Information  ent Follow up on 03/11/2017.           Discharge Medications   Allergies as of 03/10/2017      Reactions   Stinging Nettle [urtica Dioica] Anaphylaxis   Amoxicillin Rash   Has patient had a PCN reaction causing immediate rash, facial/tongue/throat swelling, SOB or lightheadedness with hypotension: Unknown Has patient had a PCN reaction causing severe rash involving mucus membranes or skin necrosis: Unknown Has patient had a PCN reaction that required hospitalization: Unknown Has patient had a PCN reaction occurring within the last 10 years: Unknown If all of the above answers are "NO", then may proceed with  Cephalosporin use.   Penicillins Rash   Has patient had a PCN reaction causing immediate rash, facial/tongue/throat swelling, SOB or lightheadedness with hypotension: Yes Has patient had a PCN reaction causing severe rash involving mucus membranes or skin necrosis: No Has patient had a PCN reaction that required hospitalization: No Has patient had a PCN reaction occurring within the last 10 years: Unknown If all of the above answers are "NO", then may proceed with Cephalosporin use.   Sulfa Antibiotics Rash      Medication List    TAKE these medications   diazepam 5 MG tablet Commonly known as:  VALIUM Take 1 tablet (5 mg total) by mouth every 8 (eight) hours as needed for anxiety.   levothyroxine 112 MCG tablet Commonly known as:  SYNTHROID, LEVOTHROID Take 112 mcg by mouth daily.   meclizine 25 MG tablet Commonly known as:  ANTIVERT Take 1 tablet (25 mg total) by mouth every 8 (eight) hours as needed for dizziness.   ondansetron 4 MG tablet Commonly known as:  ZOFRAN Take 1 tablet (4 mg total) by mouth every 8 (eight) hours as needed for nausea or vomiting.          Total Time in preparing paper work, data evaluation and todays exam - 35 minutes  [place] BilberryPATEL, Lorimer Tiberio M.D on 03/10/2017 at 3:52 PM  [facility]   Office  [phone]

## 2017-03-10 NOTE — Progress Notes (Signed)
Physical Therapy Treatment Patient Details Name: Andrea SaxonKaren Spence MRN: [id number] DOB: 1945-06-13 Today's Date: 03/10/2017    History of Present Illness Pt is a 71 y.o. female presenting to hospital with nausea, vomiting, and dizziness.  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    PT Comments    Pt seen by ENT last night who recommended vestibular evaluation. She is able to ambulate in hallway with therapy on this date. She moves slowly due to vertigo and pt requires intermittent minA+1 due to instability. Good use of rolling walker with cues from therapist about safe hand placement. Education to patient daughter about use of walker and gait belt. Vestibular history is provided by both patient and daughter. Symptoms started Sunday and were associated with acute onset vertigo with nausea and vomiting. Symptoms lasted >24 hours and are still persistent. She feels like she wants to fall to the right in sitting and standing. Symptoms are not positional and can come on spontaneously. No known easing factors other than waiting for symptoms to pass. She feels some better since starting meclizine and valium in the hospital. Pt is legally blind so has trouble describing symptoms completely but states that it feels like "gyroscopic motion." She is complaining of a R posterior headache near her mastoid process which spreads across to the left side. No rash noted around R ear. She has a remote history of migraines but states she hasn't had one in approximately 20 years. She had one other similar bout of vertigo approximately 16-18 months ago with vertigo and headache but no vomiting. Pt denies any focal weakness or numbness/tingling. No aural fullness, tinnitus, or hearing loss of which she is aware. No recent illness or sick contacts. She does report recently standing in ankle-[place] but did  not drink any water. She [place] also [place] with significantly poor air quality near her home. MRI did not reveal acute infarct. Due to visual loss unable to perform VOR, VOR thrust, VOR cancellation, smooth pursuit, or saccade testing. She has easily observable spontaneous L horizontal beating nystagmus at central eye gaze at rest. Nystagmus does not fatigue. Nystagmus worsens with L gaze and abolishes with R gaze. Deferred Dix-Hallpike testing and headshaking nystagmus test on this date based on patient's history and exam. These tests may exaccerbate patients symptoms and delay her ability to discharge from hospital and get to outpatient ENT for further work-up. Her history and examination are most consistent with R unilateral vestibular hypofunction, possibly acute R vestibular neuritis. Other possibilities include labrynthitis and Meneire's (need audiogram) as well as vertiginous migraine. Findings communicated with neurologist who communicated with hospitalist. Pt is safe to return home with assist whenever ambulating. Recommend rolling walker for patient and communicated with care manager. She will benefit from outpatient work-up by ENT followed by vestibular therapy if indicated by findings. Pt will benefit from PT services to address deficits in strength, balance, and mobility in order to return to full function at home.       Follow Up Recommendations  Outpatient PT;Supervision for mobility/OOB;Other (comment)(if needed for vestibular therapy after ENT evaluation)     Equipment Recommendations  Rolling walker with 5" wheels    Recommendations for Other Services       Precautions / Restrictions Precautions Precautions: Fall Precaution Comments: Legally blind Restrictions Weight Bearing Restrictions: No    Mobility  Bed Mobility Overal bed mobility: Needs  Assistance Bed Mobility: Supine to Sit     Supine to sit: HOB elevated;Min assist     General bed  mobility comments: MinA+1 due to dizziness only. Pt has adequate strength/mobility to perform withouat assist  Transfers Overall transfer level: Needs assistance Equipment used: Rolling walker (2 wheeled) Transfers: Sit to/from [employer] CorporationStand;Stand Pivot Transfers Sit to Stand: Min guard         General transfer comment: CGA for sit to stand transfers. Pt performs slowly due to vertigo. Safe hand placement and good sequencing  Ambulation/Gait Ambulation/Gait assistance: Min assist Ambulation Distance (Feet): 60 Feet Assistive device: Rolling walker (2 wheeled)   Gait velocity: Decreased Gait velocity interpretation: <1.8 ft/sec, indicative of risk for recurrent falls General Gait Details: Pt is able to ambulate in hallway with therapy on this date. She moves slowly due to vertigo and pt requires intermittent minA+1 due to instability. Good use of rolling walker with cues from therapist about safe hand placement. Education to patient daughter about use of walker and gait belt   Stairs            Wheelchair Mobility    Modified Rankin (Stroke Patients Only)       Balance Overall balance assessment: Needs assistance Sitting-balance support: No upper extremity supported;Feet supported Sitting balance-Leahy Scale: Good Sitting balance - Comments: steady sitting but feels the sensation of falling to her right   Standing balance support: Single extremity supported Standing balance-Leahy Scale: Poor Standing balance comment: requires at least single UE support for static standing balance                            Cognition Arousal/Alertness: Awake/alert Behavior During Therapy: WFL for tasks assessed/performed Overall Cognitive Status: Within Functional Limits for tasks assessed                                        Exercises      General Comments        Pertinent Vitals/Pain Pain Assessment: 0-10 Pain Score: 4  Pain Location: R posterior  lateral part of head near R ear Pain Descriptors / Indicators: Headache Pain Intervention(s): Monitored during session;Other (comment)(Neurology notified)    Home Living                      Prior Function            PT Goals (current goals can now be found in the care plan section) Acute Rehab PT Goals Patient Stated Goal: to not be dizzy PT Goal Formulation: With patient Time For Goal Achievement: 03/23/17 Potential to Achieve Goals: Fair Progress towards PT goals: Progressing toward goals    Frequency    Min 2X/week      PT Plan Current plan remains appropriate    Co-evaluation              AM-PAC PT "6 Clicks" Daily Activity  Outcome Measure  Difficulty turning over in bed (including adjusting bedclothes, sheets and blankets)?: A Little Difficulty moving from lying on back to sitting on the side of the bed? : A Little Difficulty sitting down on and standing up from a chair with arms (e.g., wheelchair, bedside commode, etc,.)?: A Little Help needed moving to and from a bed to chair (including a wheelchair)?: A Little Help needed walking in hospital room?: A  Little Help needed climbing 3-5 steps with a railing? : A Lot 6 Click Score: 17    End of Session Equipment Utilized During Treatment: Gait belt Activity Tolerance: Patient tolerated treatment well Patient left: in bed;with call bell/phone within reach;with nursing/sitter in room;with family/visitor present Nurse Communication: Mobility status;Other (comment)(communicated to CNA) PT Visit Diagnosis: Other abnormalities of gait and mobility (R26.89);Dizziness and giddiness (R42)     Time: 1610-9604 PT Time Calculation (min) (ACUTE ONLY): 54 min  Charges:  $Therapeutic Activity: 38-52 mins                    G Codes:       Sharalyn Ink Huprich PT, DPT     Huprich,Jason 03/10/2017, 10:24 AM

## 2017-03-10 NOTE — Discharge Instructions (Signed)
[facility] ° °DIET:  °Regular diet ° °DISCHARGE CONDITION:  °Stable ° °ACTIVITY:  °Activity as tolerated ° °OXYGEN:  °Home Oxygen: No. °  °Oxygen Delivery: room air ° °DISCHARGE LOCATION:  °home  ° ° °ADDITIONAL DISCHARGE INSTRUCTION: ° ° °If you experience worsening of your admission symptoms, develop shortness of breath, life threatening emergency, suicidal or homicidal thoughts you must seek medical attention immediately by calling 911 or calling your MD immediately  if symptoms less severe. ° °You Must read complete instructions/literature along with all the possible adverse reactions/side effects for all the Medicines you take and that have been prescribed to you. Take any new Medicines after you have completely understood and accpet all the possible adverse reactions/side effects.  ° °Please note ° °You were cared for by a hospitalist during your hospital stay. If you have any questions about your discharge medications or the care you received while you were in the hospital after you are discharged, you can call the unit and asked to speak with the hospitalist on call if the hospitalist that took care of you is not available. Once you are discharged, your primary care physician will handle any further medical issues. Please note that NO REFILLS for any discharge medications will be authorized once you are discharged, as it is imperative that you return to your primary care physician (or establish a relationship with a primary care physician if you do not have one) for your aftercare needs so that they can reassess your need for medications and monitor your lab values. ° ° °

## 2017-03-10 NOTE — Care Management (Signed)
Ordered walker from  [facility].  

## 2017-03-10 NOTE — Consult Note (Signed)
Reason for Consult:headache and dizziness  Referring Physician: Dr. Allena KatzPatel   CC: headache and dizziness   HPI: Andrea SaxonKaren Spence is an 71 y[place] with sudden onset of vertigo that appears to be positional in nature associated with N/V as well as R posterior headache which improved and currently 4/10.  No prior hx of similar symptoms in past. S/p MRI which didn't show any acute abnormalities.    Past Medical History:  Diagnosis Date  . History of pulmonary embolism   . Hypothyroid   . Legal blindness     Past Surgical History:  Procedure Laterality Date  . APPENDECTOMY    . BACK SURGERY    . MENISCECTOMY Left     Family History  Problem Relation Age of Onset  . CAD Mother   . CAD Father   . Stroke Brother     Social History:  reports that  has never smoked. she has never used smokeless tobacco. She reports that she drinks alcohol. Her drug history is not on file.  Allergies  Allergen Reactions  . Stinging Nettle [Urtica Dioica] Anaphylaxis  . Amoxicillin Rash    Has patient had a PCN reaction causing immediate rash, facial/tongue/throat swelling, SOB or lightheadedness with hypotension: Unknown Has patient had a PCN reaction causing severe rash involving mucus membranes or skin necrosis: Unknown Has patient had a PCN reaction that required hospitalization: Unknown Has patient had a PCN reaction occurring within the last 10 years: Unknown If all of the above answers are "NO", then may proceed with Cephalosporin use.  Marland Kitchen. Penicillins Rash    Has patient had a PCN reaction causing immediate rash, facial/tongue/throat swelling, SOB or lightheadedness with hypotension: Yes Has patient had a PCN reaction causing severe rash involving mucus membranes or skin necrosis: No Has patient had a PCN reaction that required hospitalization: No Has patient had a PCN reaction occurring within the last 10 years: Unknown If all of the above answers are "NO", then  may proceed with Cephalosporin use.   . Sulfa Antibiotics Rash    Medications: I have reviewed the patient's current medications.  ROS: History obtained from the patient  General ROS: negative for - chills, fatigue, fever, night sweats, weight gain or weight loss Psychological ROS: negative for - behavioral disorder, hallucinations, memory difficulties, mood swings or suicidal ideation Ophthalmic ROS: negative for - blurry vision, double vision, eye pain or loss of vision ENT ROS: negative for - epistaxis, nasal discharge, oral lesions, sore throat, tinnitus or vertigo Allergy and Immunology ROS: negative for - hives or itchy/watery eyes Hematological and Lymphatic ROS: negative for - bleeding problems, bruising or swollen lymph nodes Endocrine ROS: negative for - galactorrhea, hair pattern changes, polydipsia/polyuria or temperature intolerance Respiratory ROS: negative for - cough, hemoptysis, shortness of breath or wheezing Cardiovascular ROS: negative for - chest pain, dyspnea on exertion, edema or irregular heartbeat Gastrointestinal ROS: negative for - abdominal pain, diarrhea, hematemesis, nausea/vomiting or stool incontinence Genito-Urinary ROS: negative for - dysuria, hematuria, incontinence or urinary frequency/urgency Musculoskeletal ROS: negative for - joint swelling or muscular weakness Neurological ROS: as noted in HPI Dermatological ROS: negative for rash and skin lesion changes  Physical Examination: Blood pressure (!) 149/86, pulse 68, temperature 98.7 F (37.1 C), temperature source Oral, resp. rate 17, height 5\' 6"  (1.676 m), weight 68 kg (150 lb), SpO2 98 %. Neurological Examination   Mental Status: Alert, oriented, thought content appropriate.  Speech fluent without evidence of aphasia.  Able  to follow 3 step commands without difficulty. Cranial Nerves: II: Discs flat bilaterally; Visual fields grossly normal, pupils equal, round, reactive to light and  accommodation III,IV, VI: ptosis not present, extra-ocular motions intact bilaterally V,VII: smile symmetric, facial light touch sensation normal bilaterally VIII: hearing normal bilaterally IX,X: gag reflex present XI: bilateral shoulder shrug XII: midline tongue extension Motor: Right : Upper extremity   5/5    Left:     Upper extremity   5/5  Lower extremity   5/5     Lower extremity   5/5 Tone and bulk:normal tone throughout; no atrophy noted Sensory: Pinprick and light touch intact throughout, bilaterally Deep Tendon Reflexes: 2+ and symmetric throughout Plantars: Right: downgoing   Left: downgoing Cerebellar: normal finger-to-nose, normal rapid alternating movements and normal heel-to-shin test Gait: not tested     Laboratory Studies:   Basic Metabolic Panel: Recent Labs  Lab 03/07/17 1017 03/07/17 1644 03/08/17 0404  NA 140  --  142  K 3.1*  --  3.7  CL 103  --  113*  CO2 26  --  23  GLUCOSE 134*  --  101*  BUN 13  --  12  CREATININE 0.65  --  0.68  CALCIUM 8.8*  --  8.7*  MG 1.9  --   --   PHOS  --  3.2  --     Liver Function Tests: Recent Labs  Lab 03/07/17 1017  AST 30  ALT 16  ALKPHOS 68  BILITOT 0.7  PROT 6.5  ALBUMIN 3.8   No results for input(s): LIPASE, AMYLASE in the last 168 hours. No results for input(s): AMMONIA in the last 168 hours.  CBC: Recent Labs  Lab 03/07/17 1017 03/08/17 0404  WBC 10.8 7.4  NEUTROABS 8.8*  --   HGB 14.1 13.3  HCT 42.6 40.0  MCV 87.8 87.5  PLT 201 189    Cardiac Enzymes: Recent Labs  Lab 03/07/17 1017  TROPONINI <0.03    BNP: Invalid input(s): POCBNP  CBG: No results for input(s): GLUCAP in the last 168 hours.  Microbiology: Results for orders placed or performed during the hospital encounter of 03/07/17  Culture, blood (routine x 2)     Status: None (Preliminary result)   Collection Time: 03/07/17 10:17 AM  Result Value Ref Range Status   Specimen Description BLOOD LT [facility]  Final   Special  Requests   Final    BOTTLES DRAWN AEROBIC AND ANAEROBIC Blood Culture adequate volume   Culture NO GROWTH 3 DAYS  Final   Report Status PENDING  Incomplete  Culture, blood (routine x 2)     Status: None (Preliminary result)   Collection Time: 03/07/17 10:17 AM  Result Value Ref Range Status   Specimen Description BLOOD RT [facility]  Final   Special Requests   Final    BOTTLES DRAWN AEROBIC AND ANAEROBIC Blood Culture adequate volume   Culture NO GROWTH 3 DAYS  Final   Report Status PENDING  Incomplete    Coagulation Studies: No results for input(s): LABPROT, INR in the last 72 hours.  Urinalysis:  Recent Labs  Lab 03/07/17 1130  COLORURINE YELLOW*  LABSPEC 1.010  PHURINE 7.0  GLUCOSEU NEGATIVE  HGBUR SMALL*  BILIRUBINUR NEGATIVE  KETONESUR NEGATIVE  PROTEINUR NEGATIVE  NITRITE NEGATIVE  LEUKOCYTESUR NEGATIVE    Lipid Panel:  No results found for: CHOL, TRIG, HDL, CHOLHDL, VLDL, LDLCALC  HgbA1C: No results found for: HGBA1C  Urine Drug Screen:  No results found  for: LABOPIA, COCAINSCRNUR, LABBENZ, AMPHETMU, THCU, LABBARB  Alcohol Level: No results for input(s): ETH in the last 168 hours.   Imaging: Mr Brain Wo Contrast  Result Date: 03/08/2017 CLINICAL DATA:  Vertigo beginning yesterday, associated emesis. History of hypothyroidism, pulmonary embolism, blindness. EXAM: MRI HEAD WITHOUT CONTRAST TECHNIQUE: Multiplanar, multiecho pulse sequences of the brain and surrounding structures were obtained without intravenous contrast. COMPARISON:  CT HEAD March 07, 2017 FINDINGS: Multiple sequences are mildly motion degraded. BRAIN: No reduced diffusion to suggest acute ischemia. No susceptibility artifact to suggest hemorrhage. The ventricles and sulci are normal for patient's age. Patchy supratentorial white matter FLAIR T2 hyperintensities. No suspicious parenchymal signal, mass or mass effect. No abnormal extra-axial fluid collections. 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 VASCULAR: Normal major intracranial vascular flow voids present at skull base. SKULL AND UPPER CERVICAL SPINE: No abnormal sellar expansion. No suspicious calvarial bone marrow signal. Faint bright T1 and bright T2 signal LEFT frontal calvarium most compatible with hemangioma. Craniocervical junction maintained. SINUSES/ORBITS: Trace paranasal sinus mucosal thickening without air-fluid levels. Mastoid air cells are well aerated. The included ocular globes and orbital contents are non-suspicious. OTHER: None. IMPRESSION: 1. No acute intracranial process on this motion degraded examination. 2. Involutional changes and moderate chronic small vessel ischemic disease. Electronically Signed   By: Awilda Metroourtnay  Bloomer M.D.   On: 03/08/2017 13:33     Assessment/Plan: 71 y.o. [place] presented with sudden onset of vertigo that appears to be positional in nature associated with N/V as well as R posterior headache which improved and currently 4/10.  No prior hx of similar symptoms in past. S/p MRI which didn't show any acute abnormalities.    Symptoms are positional along with nystagmus.  This is likely vertigo and inner ear related  MRI no abnormalities Agree with Valium/meclizine  Appreciate ENT input as well as great work by physical therapy and vestibular testing Needs ENT as out pt  Gave decadron x 1 and 2 gm Mg Sulfate for headache and steroids might help with potential vestibular neuritis No further testing from neuro stand point D/c planning Addie Alonge  03/10/2017, 10:33 AM

## 2017-03-12 LAB — CULTURE, BLOOD (ROUTINE X 2)
CULTURE: NO GROWTH
Culture: NO GROWTH
SPECIAL REQUESTS: ADEQUATE
Special Requests: ADEQUATE

## 2019-02-03 IMAGING — CT CT HEAD W/O CM
3 series · 16 of 47 positions shown, 19 images · non-contrast
Comparison: None.

CLINICAL DATA: Penis, nausea

EXAM:
CT HEAD WITHOUT CONTRAST
TECHNIQUE: Contiguous axial images were obtained from the base of the skull
through the vertex without intravenous contrast.

[Series 2: head wo · axial · 0.47mm/px · z∈[+407,+532]mm · 10 of 31 slices shown, 13 images]
[im 3/31  brain]
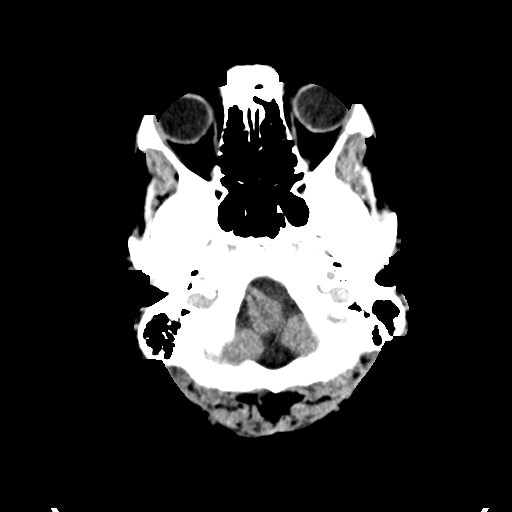
[im 3/31  bone]
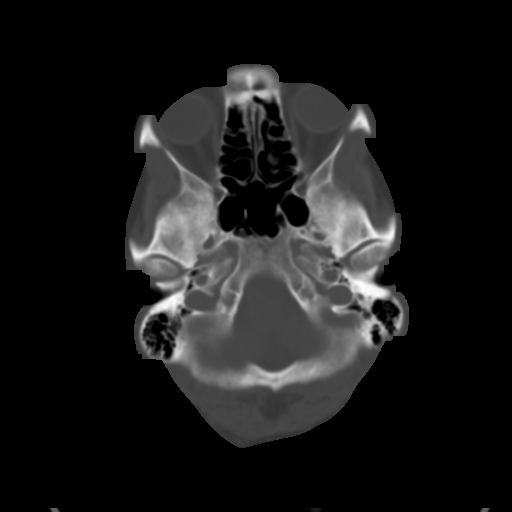
[im 6/31  brain]
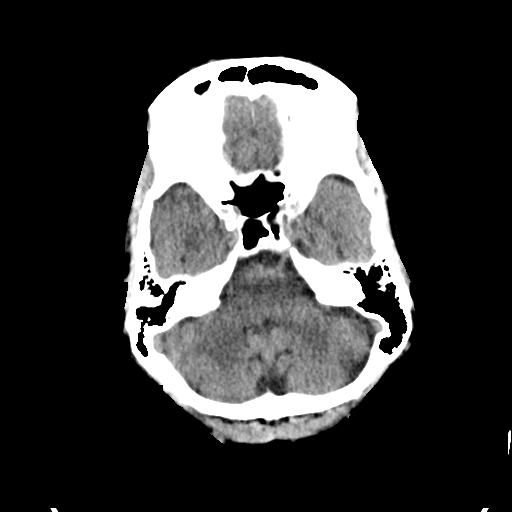
[im 9/31  brain]
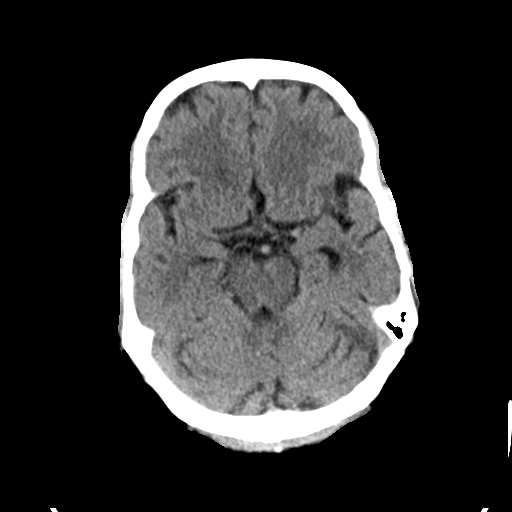
[im 11/31  brain]
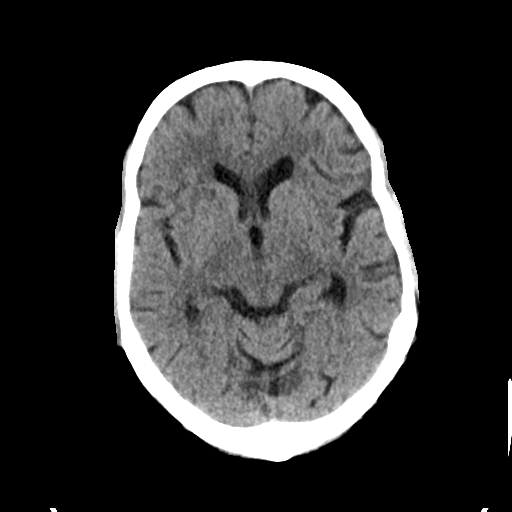
[im 14/31  brain]
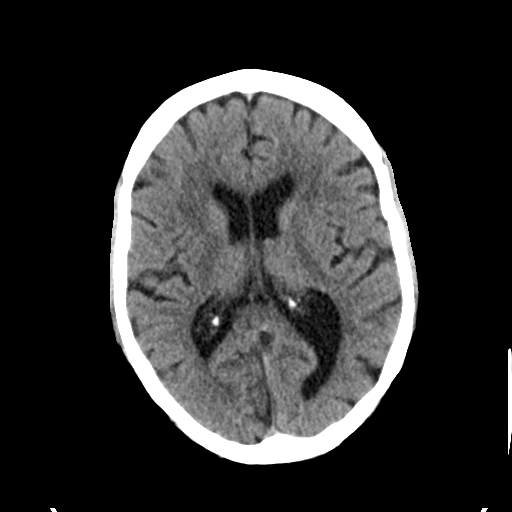
[im 14/31  bone]
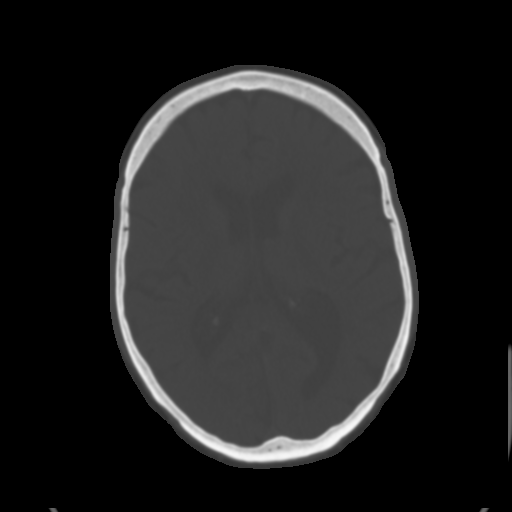
[im 17/31  brain]
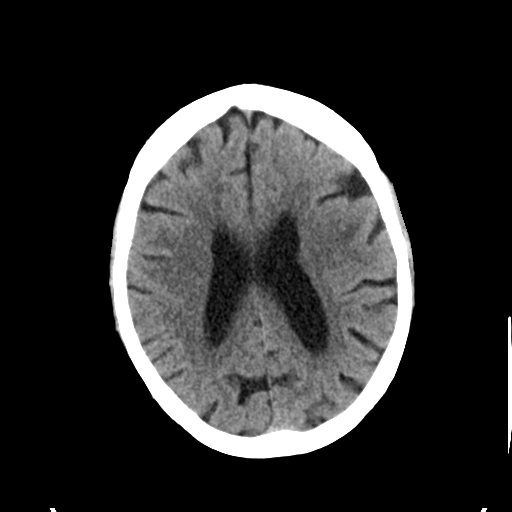
[im 20/31  brain]
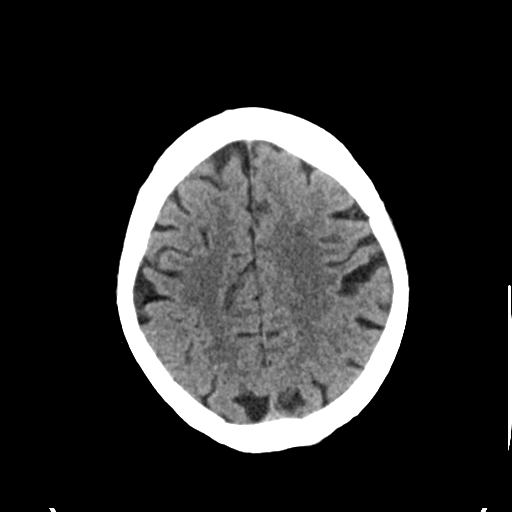
[im 23/31  brain]
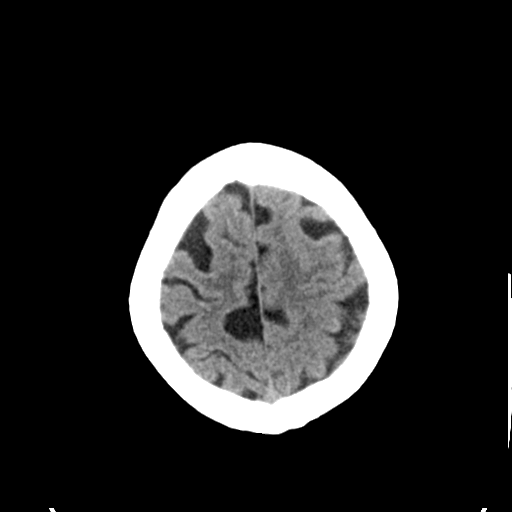
[im 25/31  brain]
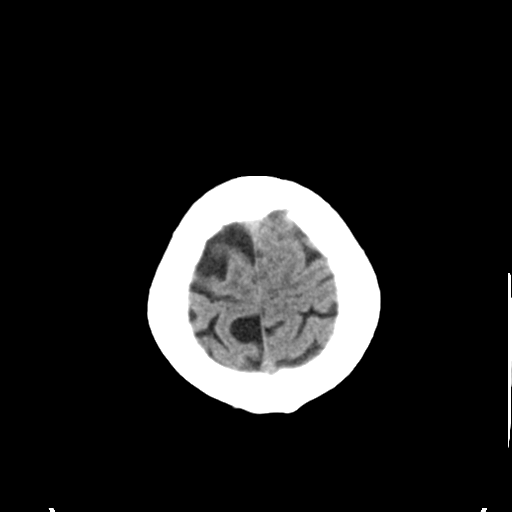
[im 25/31  bone]
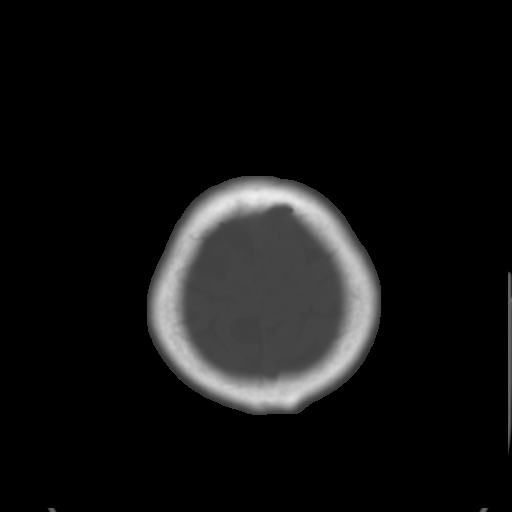
[im 28/31  brain]
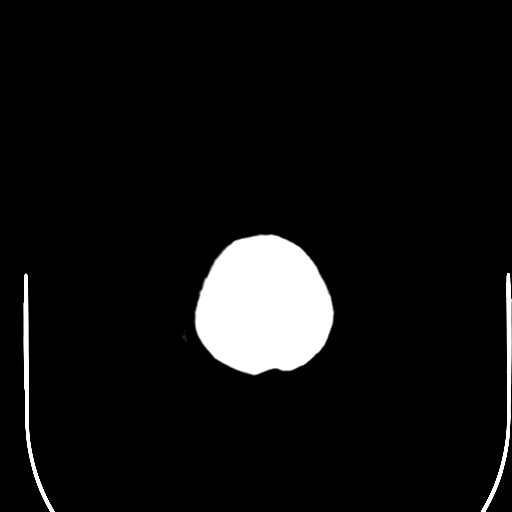

[Series 4: coronal soft tissue · coronal · 0.29mm/px · 3 of 67 slices shown]
[im 23/67  brain]
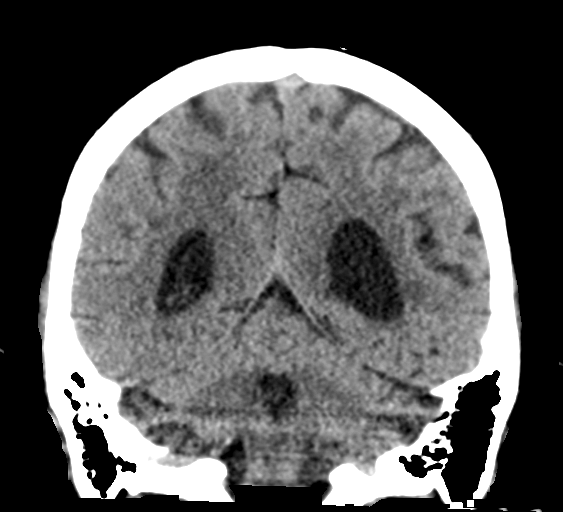
[im 30/67  brain]
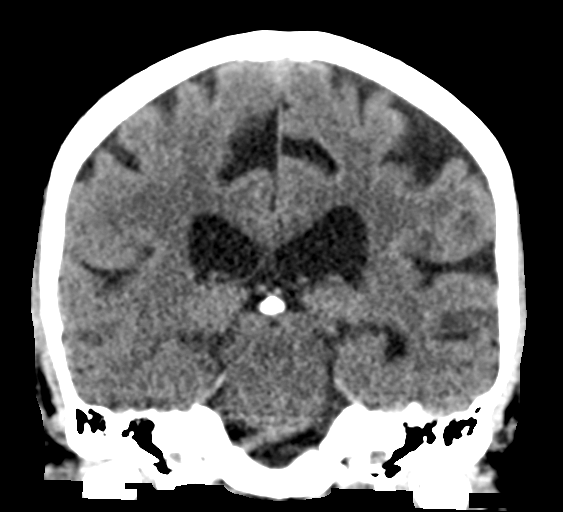
[im 37/67  brain]
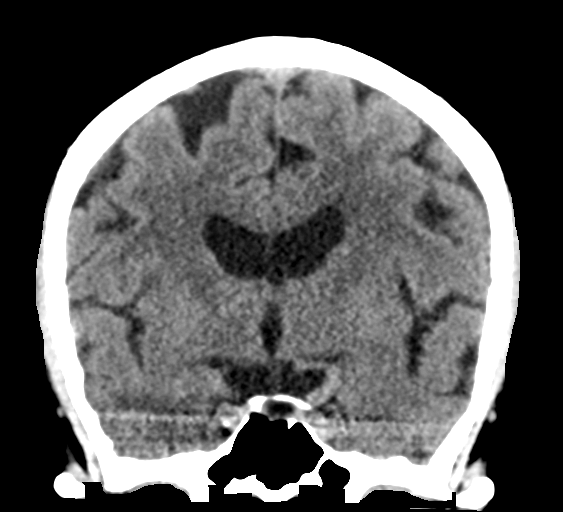

[Series 5: sagittal soft tissue · sagittal · 0.29mm/px · 3 of 53 slices shown]
[im 18/53  brain]
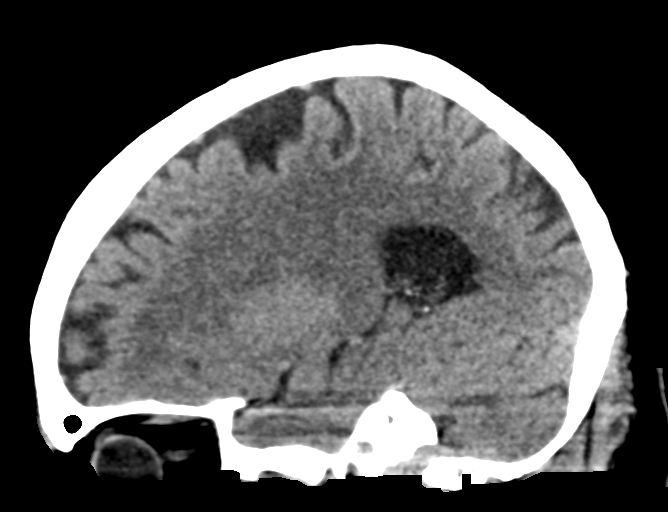
[im 27/53  brain]
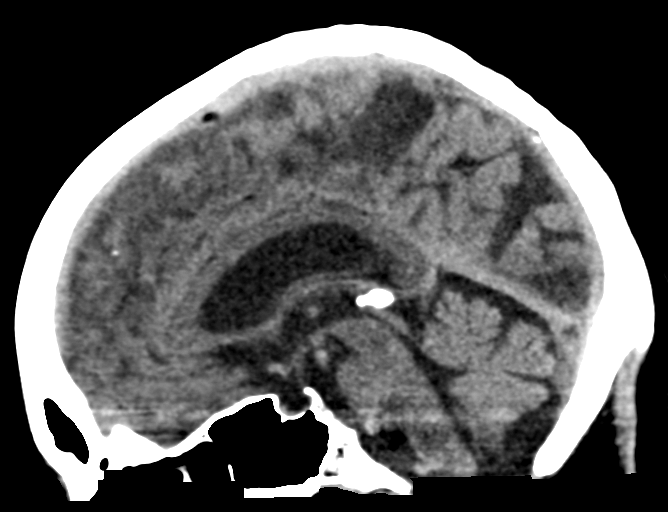
[im 35/53  brain]
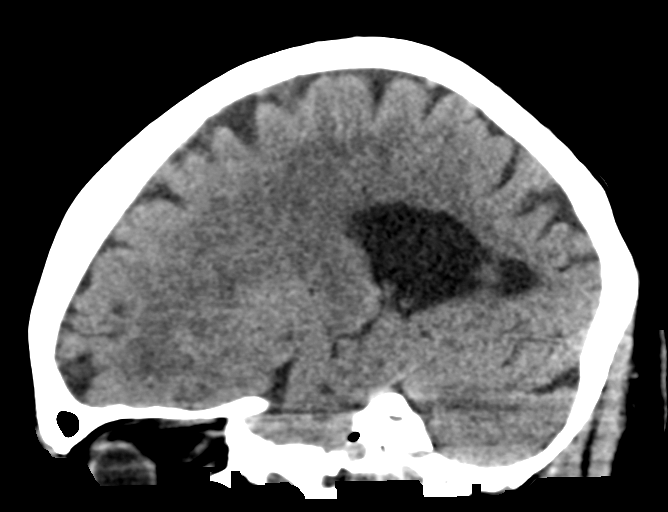

[16 of 47 positions shown; findings below may reference images not displayed]

FINDINGS: Brain: Mild age related volume loss. No acute intracranial
abnormality. Specifically, no hemorrhage, hydrocephalus, mass
lesion, acute infarction, or significant intracranial injury.

Vascular: No hyperdense vessel or unexpected calcification.

Skull: No acute calvarial abnormality.

Sinuses/Orbits: Visualized paranasal sinuses and mastoids clear.
Orbital soft tissues unremarkable.

Other: None
IMPRESSION: Diffuse cerebral atrophy.  No acute intracranial abnormality.

## 2019-07-11 LAB — COVID-19 (OUTSIDE ORGANIZATION): COVID-19 Results (Outside Organization): NOT DETECTED

## 2019-07-12 HISTORY — PX: COLONOSCOPY: GILAB00002

## 2020-05-06 LAB — COVID-19 (OUTSIDE ORGANIZATION): COVID-19 Results (Outside Organization): NOT DETECTED

## 2020-05-07 HISTORY — PX: PR TOTAL KNEE ARTHROPLASTY: 27447

## 2020-10-24 LAB — COVID-19 (OUTSIDE ORGANIZATION): COVID-19 Results (Outside Organization): NOT DETECTED

## 2021-08-22 ENCOUNTER — Ambulatory Visit: Admit: 2021-08-22 | Discharge: 2021-08-22 | Disposition: A | Discharge status: Home / Self Care | Payer: Self-pay

## 2021-08-22 HISTORY — PX: IR UPPER EXTREMITY VENOGRAPHY /INTERVENTION: RADIR00995

## 2021-09-08 NOTE — Progress Notes (Addendum)
[facility]  Comprehensive Ophthalmology    Referring/Consulting Provider: Ellin Saba, MD  [address]  [address] [place],  [place] [address]      History of Present Illness: Lori Lewis is a 57yr female here for chief complaint of cataracts.  NP.   LEE w/ Dr. Malissa Hippo.  Longstanding poor vision due to Stargardt.  Vision was HM OU in 2014 with Dr. Orpah Cobb.  Patient has noticed a significant decline in vision in the past several months.  Previous was able to use closed circuit TV but this has become much more difficult to use lately.    Is unable to write out her checks on her own.  Prev used CC TV which has become much more difficult to use.   OS has tended to be stronger than OD.  Eyes are comfortable, no pain.       Past Ocular History:   Stargardt disease  Dry eyes  Cataracts    Ocular Medications:  none    Past Medical History:  No past medical history on file.  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 Elliot Dally for evaluation      2.  Cataracts OU  Unclear if recent subjective decline in vision related to Stargardt vs cataracts.   Discussed that surgery will likely not significantly improve vision.  However, I offered the option for cataract surgery with the understanding that this may help allow more light into eyes though visual prognosis is guarded.   She elects to monitor cataracts for now and will reassess in 6 months.     In the meantime I recommended that she see Dr. Elliot Dally for evaluation.       Refer to Dr. 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 I  agree with the resident's findings except for any changes as documented in my note.      Darlys Gales, MD  Assistant Professor of Ophthalmology  [facility]  Pager: [phone]    Addendum 12/29/21:    Dr Elliot Dally note 12/30/21: CEIOL OS may be indicated

## 2021-09-09 ENCOUNTER — Ambulatory Visit: Payer: Medicare Other | Admitting: Ophthalmology

## 2021-09-09 DIAGNOSIS — H3530 Unspecified macular degeneration: Secondary | ICD-10-CM

## 2021-09-09 DIAGNOSIS — H25813 Combined forms of age-related cataract, bilateral: Secondary | ICD-10-CM

## 2021-09-09 DIAGNOSIS — H3553 Other dystrophies primarily involving the sensory retina: Secondary | ICD-10-CM

## 2021-09-09 NOTE — Progress Notes (Signed)
[facility]  Comprehensive Ophthalmology    Referring/Consulting Provider: Ellin Saba, MD  [address]  [address] [place],  [place] [address]      History of Present Illness: Lori Lewis is a 83yr female with history of Stargardt disease here for cataract evaluation.    Patient saw Dr. Raynelle Fanning Helmus recently, was told that maybe cataract removal would be helpful, was referred here.    Patient reports over the past 18 months, maybe vision has gotten worse in both eyes. Not able to see things quite as well in the fridge and CCTV is harder to use (used to be able to write checks on CCTV but harder now). Has had flashes in both eyes for a long time.     Also reports having pressure behind the right eye intermittently, sometimes it feels heavy when laying down.     Currently doing a lot of audiobooks, pays university students to read.    Symptoms of giant cell arteritis (all negative):   []  Sudden, permanent loss of vision in one eye  []  Persistent, severe temple head pain, usually in temple area   []  Scalp tenderness   []  Jaw pain/claudication when chewing or opening mouth wide   []  Fever   []  Fatigue   []  Unintended weight loss     Past Ocular History:   Stargardt disease  Dry eyes  Cataracts    Ocular Medications:  None    Past Medical History:  No past medical history on file.    Family Ocular History:  Sister - glaucoma    Allergies:  Not on File    Review of Systems:  A 10-point review of systems is negative except for the following: none. No DM or HTN (see scanned documentation)    Physical Exam:  See chart      Assessment & Plan:  End stage Stargardt disease  S/p experimental laser OS ([place], 1970s)    2. Cataracts, R>L  - Doubt cataract surgery would have significant benefit on visual outcome      Please see attending attestation for final assessment and plan    , MD  PGY-2, Ophthalmology  PI [phone]

## 2021-09-18 ENCOUNTER — Telehealth: Payer: Self-pay | Admitting: Ophthalmology

## 2021-09-18 NOTE — Telephone Encounter (Signed)
Pt called, verified 3x, She is requesting OV summary notes from Dr. Marcheta Grammes on 09/09/2021. She would like to know if they can be e-mail to her if possible.     [email or web address]    She would like a call back  [phone]    Thank You,  Mercer Pod  [facility]  Ophthalmology Dept.

## 2021-09-18 NOTE — Telephone Encounter (Signed)
Spoke to pt. Verified ID x3. Informed pt that we do not send medical records via email.    Chart note from 6/20 mailed to address on file per pt request.    Aldine Contes, MA II  TEI  [facility]  Vocera: "Ophthalmology Medical Assistant Team"

## 2021-09-26 ENCOUNTER — Other Ambulatory Visit: Payer: Self-pay | Admitting: Vascular Surgery

## 2021-10-01 ENCOUNTER — Ambulatory Visit: Payer: Medicare Other | Attending: Vascular Surgery | Admitting: Vascular Surgery

## 2021-10-01 ENCOUNTER — Encounter: Payer: Self-pay | Admitting: Vascular Surgery

## 2021-10-01 VITALS — BP 138/89 | HR 75 | Resp 16

## 2021-10-01 DIAGNOSIS — I872 Venous insufficiency (chronic) (peripheral): Secondary | ICD-10-CM | POA: Insufficient documentation

## 2021-10-01 DIAGNOSIS — M79605 Pain in left leg: Secondary | ICD-10-CM | POA: Insufficient documentation

## 2021-10-01 DIAGNOSIS — Z86718 Personal history of other venous thrombosis and embolism: Secondary | ICD-10-CM | POA: Insufficient documentation

## 2021-10-01 DIAGNOSIS — R0989 Other specified symptoms and signs involving the circulatory and respiratory systems: Secondary | ICD-10-CM
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
## 2021-10-01 NOTE — Nursing Note (Signed)
Vital signs taken, allergies verified, screened for pain,  Navneet Schmuck , MA II.

## 2021-10-07 NOTE — Progress Notes (Signed)
Vascular Surgery Attending Addendum:    The patient was seen and evaluated with the student doctor Malvin Johns who has documented the history, imaging, laboratory, and examination findings. I was physically present for the above document exam. I personally examined the patient, independently confirmed the above documented history. I personally and independently interpreted the imaging and laboratory work. I agree with the findings as stated in the student's note. The assessment and plan were developed together.    Briefly, the patient is a 76 year old woman with a history of left lower extremity DVT and PE as well as bilateral lower extremity varicose veins status post stripping and ligation who presents for evaluation of left lower extremity pain.  Per the patient, she had no issues until she underwent left knee replacement in February 2022.  Since that time, she has noted pain and swelling of her left lower extremity.  This pain can involve her calf or her thigh and can be dull or intense in nature.  She often notes the pain at night.  It wakes her up, requiring her to massage her leg or get up to walk around.  She does elevate her legs and wears compression stockings when able.  On my exam, she has mild-to-moderate edema of the left lower extremity with scattered varicosities throughout the left calf and thigh.  She does have hemosiderin deposition but no lipodermatosclerosis or venous ulcerations.  Her left dorsalis pedis and posterior tibial pulse are easily palpable.    I was able to view the images from her reflux study performed at [facility] on 08/22/2021.  On the left, this demonstrates no evidence of deep vein thrombosis.  There is significant deep venous reflux in the common femoral and popliteal veins.  Additionally, there is significant superficial venous reflux involving the great saphenous vein as well as perforator reflux.     Explained to the patient that it is not clear to me if her symptoms are venous in  origin, particularly as her symptoms appear to be worse at night when I would expect her venous congestion to be minimal.  Her duplex does demonstrate significant deep and superficial venous reflux but it does not appear that all superficial veins were evaluated their entirety.  I would like to repeat her left lower extremity venous reflux study.  After the studies have been completed, we can discuss the results and determine if we would like to proceed with doing any intervention.  We briefly discussed what interventions are available for venous insufficiency.  I encouraged her to continue with best medical therapy for venous disease, which includes continued elevation, exercise, and compression stocking use.  I counseled the patient about the plan of care and addressed any outstanding questions and concerns. There were no barriers to learning and the patient indicated understanding of the plan of care.     Luci Bank, MD  Assistant Professor  Department of Surgery  Division of Vascular Surgery  [facility]

## 2021-11-19 ENCOUNTER — Ambulatory Visit (HOSPITAL_BASED_OUTPATIENT_CLINIC_OR_DEPARTMENT_OTHER): Payer: Medicare Other | Admitting: Vascular Surgery

## 2021-11-19 ENCOUNTER — Ambulatory Visit
Admission: RE | Admit: 2021-11-19 | Discharge: 2021-11-19 | Disposition: A | Discharge status: Home / Self Care | Payer: Medicare Other | Source: Ambulatory Visit | Attending: Vascular Surgery | Admitting: Vascular Surgery

## 2021-11-19 ENCOUNTER — Encounter: Payer: Self-pay | Admitting: Vascular Surgery

## 2021-11-19 VITALS — BP 131/88 | HR 76 | Temp 97.5°F | Resp 18

## 2021-11-19 DIAGNOSIS — R0989 Other specified symptoms and signs involving the circulatory and respiratory systems: Secondary | ICD-10-CM | POA: Insufficient documentation

## 2021-11-19 DIAGNOSIS — Z86718 Personal history of other venous thrombosis and embolism: Secondary | ICD-10-CM | POA: Insufficient documentation

## 2021-11-19 DIAGNOSIS — I878 Other specified disorders of veins: Secondary | ICD-10-CM

## 2021-11-19 DIAGNOSIS — R52 Pain, unspecified: Secondary | ICD-10-CM | POA: Insufficient documentation

## 2021-11-19 DIAGNOSIS — I872 Venous insufficiency (chronic) (peripheral): Secondary | ICD-10-CM

## 2021-11-19 DIAGNOSIS — I83813 Varicose veins of bilateral lower extremities with pain: Secondary | ICD-10-CM | POA: Insufficient documentation

## 2021-11-19 NOTE — Nursing Note (Signed)
Vital signs taken, allergies verified, screened for pain,  Macala Baldonado , MA II.

## 2021-11-19 NOTE — Progress Notes (Addendum)
Vascular Surgery Clinic Progress Note    Date of Service: 11/19/2021  Attending: Luci Bank, MD [phone]    Chief Complaint: Follow up for venous reflux.    History of Present Illness:  Lori Lewis is a 60yr female presenting to the clinic today for follow up for bilateral VL venous reflux evaluation. [name] reports venous reflux is affecting the quality of her life. She has poor eyesight, cannot drive, and relies on walking for transportation. [name] reports pain in bilateral lower extremities. In her LLE, she reports pain around her knee, anterior leg, and shooting pain from her knee to her hip. In her RLE, she reports discomfort inside her knee "in meniscus," and pain from her knee to her ankle laterally. Aggravating factors include sitting or standing still for long periods of time. Pain sometimes wakes her from her sleep, especially when she sleeps on her side with her knees together. She also reports feeling heaviness and "cement" in her LLE upon waking. She also reports increased leg pain in the evenings. Alleviating factors include sleeping with a small pillow between her knees, sitting at the edge of her bed upon waking and moving her feet before standing up, leg elevation, and compression socks though she has not worn them consistently due to the heat.     [name] has a history of LLE DVT and PE as well as bilateral lower extremity varicose veins.     Vascular Surgery History:  2002 - stripping and ligation of unspecified veins in BLE  2002 - PR Remove Pul Art Emboli W CP Bypass      Anti-platelet: aspirin  Anticoagulation: none  Statin: none    Current Outpatient Medications   Medication Instructions    Aspirin 81 mg, ORAL, DAILY MORNING    Epinephrine (Anaphylaxis) (EPIPEN) 0.3 mg, IM, PRN    Estradiol (ESTRACE) 0.5 g, TOPICAL, 3X WEEKLY (OUTPATIENT)    Hydrocortisone (ANUSOL-HC) 2.5 % Cream 1 applicator, TOPICAL, THREE TIMES DAILY PRN    LevoTHYROxine 88 mcg Tablet 1 tablet, ORAL, DAILY  (OUTPATIENT)       Physical Exam:  Temp: 36.4 C (97.5 F) (08/30 1324)  Temp src: Tympanic (08/30 1324)  Pulse: 76 (08/30 1324)  BP: 131/88 (08/30 1324)  Resp: 18 (08/30 1324)  SpO2: 100 % (08/30 1324)  Height: --  Weight: --    Physical Exam  Constitutional:       General: She is not in acute distress.     Appearance: Normal appearance. She is not ill-appearing.   Cardiovascular:      Rate and Rhythm: Normal rate and regular rhythm.   Pulmonary:      Effort: Pulmonary effort is normal.      Breath sounds: Normal breath sounds.   Musculoskeletal:      Right lower leg: Swelling and tenderness present.      Left lower leg: Swelling and tenderness present.   Skin:     General: Skin is warm and dry.   Neurological:      General: No focal deficit present.      Mental Status: She is alert and oriented to person, place, and time.   Psychiatric:         Mood and Affect: Mood normal.         Behavior: Behavior normal.          Vascular Exam:  RIGHT  PT: Palpable  DP: Palpable    LEFT  PT: Palpable  DP: Palpable  Imaging:  VL VENOUS REFLUX EVAL, BILATERAL was independently viewed and interpreted by Luci Bank, MD [phone] and reviewed with the patient.    VL VENOUS REFLUX EVAL, BILATERAL    Result Date: 11/19/2021  NARRATIVE FROM IMAGING REPORT: Department: [facility] Vascular Lab Patient: [id number] [facility], [facility]OM[facility]) Account Number: [id number] CPT Code: 25427 ICD-9: ; Referring Physician: Luci Bank, MD  Indication:  Patient with history of lower extremity varicosities; prior history of PE and left LE DVT many years  prior.  Presents with left lower extremity pain and swelling since left knee surgery 2 years prior.   Evaluate lower extremities for deep vein thrombosis and venous reflux.  Findings:  RIGHT                              Reflux Time  Diam AP cm  CFV                                        0.0              Mid FV                                     6.1              POP V                                      5.3               Anterior Accessory Saphenous Vein          5.1        0.29  Sapheno Femoral Junction                   0.1        0.50  GSV- Proximal Thigh                        0.0        0.48  GSV- Mid Thigh                             0.0        0.31  GSV- Distal Thigh                          0.0        0.19  GSV- Knee                                  0.0        0.25  Sapheno Popliteal Junction                            0.54  SSV- Proximal                              0.0        0.26  [place]  Vein                             0.0               LEFT                               Reflux Time  Diam AP cm  CFV                                        5.7              Mid FV                                     4.8              POP V                                      3.8              Anterior Accessory Saphenous Vein          5.5        0.76  Sapheno Femoral Junction                   5.0        1.09  GSV- Proximal Thigh                                   0.38  GSV- Mid Thigh                                        0.48  GSV- Distal Thigh                                     0.47  GSV- Knee                                             0.51  Sapheno Popliteal Junction                 0.1        0.32  SSV- Proximal                                         0.11      RIGHT: The common femoral, profunda femoral, femoral, popliteal, posterior tibial and peroneal veins are evaluated in their entirety.  Complete vein wall compressions are obtained throughout.  There is no evidence of intraluminal echoes.  The posterior tibial and peroneal veins are patent with Color Doppler.  Color flow and spectral Doppler demonstrate spontaneous and phasic  flow with brisk responses to augmentation maneuvers.  Representative waveforms are obtained from the common femoral,  femoral and popliteal veins.  Complete vein wall compressions are obtained from the great and small saphenous veins.  The great saphenous vein is contiguous from the knee to the  groin with no reflux identified.  A patent anterior accessory vein is evaluated;  reflux is identified however this vein does not appear to be main source of patient complaints.  Marked reflux is noted in the femoral vein, popliteal vein and into the gastrocnemius veins.  A cluster of varicose veins are visualized at the distal thigh.   LEFT: The common femoral, profunda femoral, femoral, popliteal, posterior tibial and peroneal veins are evaluated in their entirety.  Complete vein wall compressions are obtained throughout.  There is no evidence of intraluminal echoes.  The posterior tibial and peroneal veins are patent with Color Doppler.  Color flow and spectral Doppler demonstrate spontaneous and phasic flow with brisk responses to augmentation maneuvers.  Representative waveforms are obtained from the common femoral,  femoral and popliteal veins.  Complete vein wall compressions are obtained from the great and small saphenous veins.  The great saphenous vein is patent to the distal thigh at which levels a perforator vein is identified;  the great saphenous vein is not contiguous to the knee.  No obvious varicosities appear  to connect to this vein.  A patent anterior accessory vein is identified throughout the thigh with marked reflux identified.  Varicosities in the calf eventually connect to the AASV in the thigh, however small cluster perforator veins are identified in the thigh as well which connect to the deep veins. Marked reflux is noted throughout the deep system.  No significant perforators are identified.   The patient was scanned in a standing position for venous reflux evaluation.   IMPRESSION: THIS IS A PRELIMINARY REPORT, PHYSICIAN REVIEW AND INTERPRETATION PENDING End of Report          Assessment:  Herbie SaxonKaren Fichter is a [age]yr female with a history of LLE DVT and PE as well as bilateral lower extremity varicose veins presenting to the clinic today for follow up for bilateral VL venous reflux  evaluation. She would benefit from a non-surgical solution to alleviate lower extremity pain.          Plan:    Problem 1: vein reflux  Ms. Divito states the discomfort, edema, and swelling is affecting her quality of life. On the right side, VL venous reflux evaluation showed marked reflux in the femoral vein, popliteal vein and into the gastrocnemius veins. Reflux was also noted in the anterior accessory vein. On the left side, marked reflux is noted throughout the deep system (common femoral vein,femoral vein, popliteal vein) and the anterior accessory vein. Recommended the use of compression tights. Discussed treating the LLE with varithena foam sclerotherapy first as a trial to assess if the therapy alleviates her pain. Patient will call to schedule.       All questions were answered and the patient verbalized her understanding and agreed with the above plan.         The patient was seen and examined with Dr. Luci BankMimmie Kwong, MD [phone]#[id number], vascular surgery attending of record, who is in agreement with this assessment and plan.       Bard Herbertosa Avan Gullett, Med Student Medical Student  Available via [employer]  Vascular Surgery Service Pager: [phone]      Vascular Surgery Attending Addendum:    The patient was  seen and evaluated with the student doctor Mihcael Ledee who has documented the history, imaging, laboratory, and examination findings. I was physically present for the above document exam. I personally examined the patient, independently confirmed the above documented history. I personally and independently interpreted the imaging and laboratory work. I agree with the findings as stated in the student's note. The assessment and plan were developed together.    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  Since her last clinic visit,  she reports that her symptoms are overall stable.  She does continue to have significant pain of the left lower extremity from the hip down which is exacerbated by prolonged standing or walking.  Her right lower extremity pain symptoms are less significant and typically involve the lower leg from the knee down to the ankle.  She does understand the importance of compression stockings but admits that she has not been as consistent as she could be due to the heat.  When she does wear her compression stockings, she does note that her symptoms are better.  However, due the fact that she is relying on walking long distances due to her vision loss, her bilateral extremity pain is significantly limiting.    On my exam, he has scattered varicosities on the bilateral lower extremities with numerous reticular veins and telangiectasias of the lower legs bilaterally.  Her bilateral dorsalis pedis and posterior tibial pulses are easily palpable.  On 11/20/2021, the patient did undergo bilateral lower extremity venous reflux studies.  These demonstrate no evidence of deep vein thrombosis in the bilateral lower extremities.  There is significant deep venous reflux on the right involving the femoral and popliteal veins.  On the left, there is significant deep venous reflux involving the common femoral, femoral, and popliteal veins.  The right great saphenous vein and small saphenous veins are patent without reflux.  There is significant reflux involving the anterior accessory saphenous vein.  On the left, the great and small saphenous veins are patent without reflux.  There is significant reflux involving the anterior accessory saphenous vein which extends down to the level of the knee and calf.    I explained to the patient that based on the severity of her symptoms, particularly on the left side, as well as the prominent anterior accessory saphenous vein reflux, also particularly on the left side, I think it is reasonable to  consider treating her anterior accessory saphenous vein reflux to see if this helps with her symptoms.  It may be that much of her symptoms are due to her deep venous reflux, which, I reminded the patient, is not treatable.  However, we will not know how much contribution of the anterior accessory saphenous vein reflux has to her symptoms until it has been treated.  I explained to her that based on the anatomy of her anterior accessory saphenous vein, it will likely be a combination of foam sclerotherapy and phlebectomy.  The patient would like think a bit more about whether or not she would like to proceed with treatment.  She plans to call my clinic back when she is made a decision.  In the interim, we discussed the importance of continued best medical therapy, including continued exercise, elevation when able, and compression therapy.  In particular, consistent use of compression only improve the success of our intervention but  may reduce her risk of recurrence.  We discussed different compression options, including the use of compression leggings or compression stockings, which she thinks may be a better option for her.  She will look into purchasing these with information we provided her in clinic.  I counseled the patient about the plan of care and addressed any outstanding questions and concerns. There were no barriers to learning and the patient indicated understanding of the plan of care.     Luci Bank, MD  Assistant Professor  Department of Surgery  Division of Vascular Surgery  [facility]

## 2021-11-23 NOTE — Progress Notes (Signed)
Vascular Surgery Attending Addendum:    The patient was seen and evaluated with the student doctor Magana who has documented the history, imaging, laboratory, and examination findings. I was physically present for the above document exam. I personally examined the patient, independently confirmed the above documented history. I personally and independently interpreted the imaging and laboratory work. I agree with the findings as stated in the student's note. The assessment and plan were developed together.    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  Since her last clinic visit, she reports that her symptoms are overall stable.  She does continue to have significant pain of the left lower extremity from the hip down which is exacerbated by prolonged standing or walking.  Her right lower extremity pain symptoms are less significant and typically involve the lower leg from the knee down to the ankle.  She does understand the importance of compression stockings but admits that she has not been as consistent as she could be due to the heat.  When she does wear her compression stockings, she does note that her symptoms are better.  However, due the fact that she is relying on walking long distances due to her vision loss, her bilateral extremity pain is significantly limiting.    On my exam, he has scattered varicosities on the bilateral lower extremities with numerous reticular veins and telangiectasias of the lower legs bilaterally.  Her bilateral dorsalis pedis and posterior tibial pulses are easily palpable.  On 11/20/2021, the patient did undergo bilateral lower extremity venous reflux studies.  These demonstrate no evidence of deep vein thrombosis in the bilateral lower extremities.  There is significant deep venous  reflux on the right involving the femoral and popliteal veins.  On the left, there is significant deep venous reflux involving the common femoral, femoral, and popliteal veins.  The right great saphenous vein and small saphenous veins are patent without reflux.  There is significant reflux involving the anterior accessory saphenous vein.  On the left, the great and small saphenous veins are patent without reflux.  There is significant reflux involving the anterior accessory saphenous vein which extends down to the level of the knee and calf.    I explained to the patient that based on the severity of her symptoms, particularly on the left side, as well as the prominent anterior accessory saphenous vein reflux, also particularly on the left side, I think it is reasonable to consider treating her anterior accessory saphenous vein reflux to see if this helps with her symptoms.  It may be that much of her symptoms are due to her deep venous reflux, which, I reminded the patient, is not treatable.  However, we will not know how much contribution of the anterior accessory saphenous vein reflux has to her symptoms until it has been treated.  I explained to her that based on the anatomy of her anterior accessory saphenous vein, it will likely be a combination of foam sclerotherapy and phlebectomy.  The patient would like think a bit more about whether or not she would like to proceed with treatment.  She plans to call my clinic back when she is made a decision.  In the interim, we discussed the importance of continued best medical therapy, including continued exercise, elevation when able, and compression therapy.  In particular, consistent use of  compression only improve the success of our intervention but may reduce her risk of recurrence.  We discussed different compression options, including the use of compression leggings or compression stockings, which she thinks may be a better option for her.  She will look into  purchasing these with information we provided her in clinic.  I counseled the patient about the plan of care and addressed any outstanding questions and concerns. There were no barriers to learning and the patient indicated understanding of the plan of care.     Luci Bank, MD  Assistant Professor  Department of Surgery  Division of Vascular Surgery  [facility]

## 2021-12-25 ENCOUNTER — Telehealth: Payer: Self-pay | Admitting: Vascular Surgery

## 2021-12-25 DIAGNOSIS — R52 Pain, unspecified: Secondary | ICD-10-CM

## 2021-12-25 NOTE — Telephone Encounter (Signed)
Received tele contact from patient, verified using three patient identifiers . Patient would like to proceed with surgery.     Awanda Mink [facility]) [facility]   Vascular [place] [address]  Phone: [phone] Ext: [phone]  Fax: [phone]

## 2021-12-29 ENCOUNTER — Ambulatory Visit: Payer: Medicare Other | Attending: Retina Specialist | Admitting: Retina Specialist

## 2021-12-29 ENCOUNTER — Ambulatory Visit: Payer: Medicare Other

## 2021-12-29 DIAGNOSIS — H3553 Other dystrophies primarily involving the sensory retina: Secondary | ICD-10-CM | POA: Insufficient documentation

## 2021-12-29 DIAGNOSIS — H25043 Posterior subcapsular polar age-related cataract, bilateral: Secondary | ICD-10-CM | POA: Insufficient documentation

## 2021-12-29 DIAGNOSIS — H3540 Unspecified peripheral retinal degeneration: Secondary | ICD-10-CM | POA: Insufficient documentation

## 2021-12-29 DIAGNOSIS — H3530 Unspecified macular degeneration: Secondary | ICD-10-CM

## 2021-12-29 NOTE — Progress Notes (Deleted)
q 

## 2021-12-29 NOTE — Progress Notes (Signed)
[facility]  INHERITED RETINAL / MACULAR DYSTROPHY CLINIC  VITREO-RETINAL SERVICE  ATTENDING NOTE  TESTING TODAY.   FF-ERG.   [facility] IRD Panel.    Lori Lewis  10 F    # [id number]     [name].     New to Dr. Elliot Dally  Referred by Dr. Marcheta Grammes, last seen 09/09/21, previously saw Dr. Orpah Cobb 08/11/12      Exam TODAY   2021-12-29   VA:  OD: HM   OS: HM  IOP: OD: 19   OS: 17    Previous Exam 09/09/21  VA:  OD: HM   OS: HM  IOP: OD: 21   OS: 17  Per 09/09/21 Dr. Dianne Dun note:  HPI: Lori Lewis is a 56yr female here for chief complaint of cataracts.  NP.   LEE w/ Dr. Malissa Hippo.  Longstanding poor vision due to Stargardt.  Vision was HM OU in 2014 with Dr. Orpah Cobb.  Patient has noticed a significant decline in vision in the past several months.  Previous was able to use closed circuit TV but this has become much more difficult to use lately.    Is unable to write out her checks on her own.  Prev used CC TV which has become much more difficult to use.   OS has tended to be stronger than OD.  Eyes are comfortable, no pain.    Dr. 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  Cataracts OU  Unclear if recent subjective decline in vision related to Stargardt vs cataracts.   Discussed that surgery will likely not significantly improve vision.  However, I offered the option for cataract surgery with the understanding that this may help allow more light into eyes though visual prognosis is guarded.   She elects to monitor cataracts for now and will reassess in 6 months.     OCT 09/09/21            TESTING TODAY.   (2021-12-29)  Full-Field ERG.     (2021-12-29)  Dark-Adapted Recordings.    Right eye.   No discernable response to dim (0.01) or bright (3.0 cd-sec/m2) stimuli.   LEFT eye.   No discernable response to dim (0.01 cd-sec/m2) stimuli.     bright (10.0 cd-sec/m2) stimuli gives trace response (>95% reduced).  LIGHT-Adapted Recordings.     Right eye.   No discernable response to single flash or to 30 Hz flicker stimuli.    LEFT eye.   Both single flash and 30 Hz flicker stimuli give 10 uV responses, repeatable.    INTERPRETATION:    Right eye responses are non-recordable for DA and LA conditions.     Left eye is >95% reduced DA and LA but retains some minimal global ERG activity.                ASSESSMENT and CLINICAL IMPRESSION.  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  Extensive retinal degeneration is not characteristic of Stargardt.  Molecular confirmation is initiated today with blood sample sent to [facility] for IRD panel testing  Recent progression of further central vision loss.     Recent OCT shows loss of all outer retinal structures across the macula.  The extensive disruption of retinal structure limits accurate prediction of corresponding visual function laws beyond that reported by the patient.  FF-ERG today shows no recordable  responses RIGHT eye, but 10 uv photopic flicker response OS.   This indicates several more years of "vision" remaining OS.  All clinical results were shared and discussed with the patient.   This is in stage vision loss across the macular. Future rescue will not be possible.   Brunescent grade 3 lenses OU with small PCS.   It is likely that her overall visual function would improve after cataract surgery.   Suggest best opportunity is left eye which retains slight ERG responses.    This was discussed with a patient and she will consider this and consult again with Dr. Lendon Collar.      PLAN.  Follow up recommended in one year.  Advised good nutrition, and supplementing diet with oily fish for omega-3 fatty acids.     NOTE TO DR. JEFF MA.  Lori Lewis  31 F    # [id number].     She is a candidate for cataract surgery left eye.   This eye retains some but minimal ERG activity, indicating that providing more light would be of benefit.  This was discussed with a patient and she  will consider this and consult again with Dr. Lendon Collar.    Teaching Statement:  I personally interviewed and examined the patient today.  I discussed the case with the Fellow/Resident.  My additional examination notes are provided here.        PREPARATION TIME & SPENT WITH PATIENT TODAY  30 min.   direct time with patient today, including reviewing/discussing current and prior tests.  We covered genetic pattern and implications for future offspring.    20 min.   in preparation and reviewing prior studies.     Lori Konig, MD, PhD.       Professor of Ophthalmology, and Attending Physician  Director, Inherited Retinal and [place]  [facility], [place]

## 2021-12-30 ENCOUNTER — Other Ambulatory Visit: Payer: Self-pay | Admitting: Vascular Surgery

## 2021-12-30 DIAGNOSIS — R0989 Other specified symptoms and signs involving the circulatory and respiratory systems: Secondary | ICD-10-CM

## 2022-01-12 ENCOUNTER — Telehealth: Payer: Self-pay | Admitting: Vascular Surgery

## 2022-01-12 NOTE — Telephone Encounter (Signed)
Patient called to ask for a nurse call back regarding her procedure the is scheduled for 02/18/22    Marcine Matar [facility]  Vascular Clinic  [place]  [place], [place]. [address]  Phone: [phone]  Fax: [phone]

## 2022-01-13 NOTE — Telephone Encounter (Signed)
Returned phone call to patient. Patient had questions about what procedure she will be having with Dr. Rozelle Logan. 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 Arlyn Dunning on 02/18/22 at 8:00 am. Please read the following instructions carefully.     Arrive at the [facility] at least 15 minutes prior to your procedure start time. We are located in the [facility]) building on the second floor, in [address] Our street address is [place]. Parking is available in the parking structure off [address]. There is on-going construction in the area, so please allow yourself extra time to arrive and park. Bring your parking stub with you.   Stop eating and drinking two hours prior to your procedure start time. If you need to take a medication, you may do so with a sip of water.   Take your medications as you normally would.   Bring your insurance card and photo ID with you the day of the procedure.  Bring your thigh high compression hose with you the day of the procedure. Compression hose should have a rating of 20-30 mmHg unless otherwise instructed. Please purchase on [email or web address] or there are several local businesses that carry thigh high compression hose. Just call if you have any questions.  You must have a ride home. Most procedures take about 2 hours including pre- and post-procedure time. Post procedure you will be walking for 10 minutes.  Wear loose, comfortable clothes and leave all valuables at home.       For your information:      Post procedure recovery and restrictions:    For the first 7 days after your procedure, the following four activities are vital to your healing process to ensure that the veins we treated stay closed.     Wear your compression hose and/or compression  device at all times for the first 7 days (except when showering).Ideally, you would wear compression whenever possible thereafter to prevent issues with your other veins.     Take two 30 minute walks every day. If you can't walk for 30 minutes at one time, break the 30 minutes into shorter periods of time.    Avoid vigorous exercises such as jogging, high-impact aerobics, sit-ups, leg strength training, squats, or heaving lifting over 10 pounds.    Avoid soaking in hot baths, pools and hot tubs for the first 7 days.    If you will be traveling a long distance by car or plane, please speak to your provider for guidance.      Expect that you may feel hardness along the vein. This could cause pain when pushed on.  A tightening sensation is normal and may last a few days. It is common to see bruising at the site or along the vein. Over the next several weeks your veins may change in texture and color as the body remodels the closed vein. Lumps, bumps and hardness may form and are normal.     Medication and post-operative pain management:     You may take over the counter antihistamines (Benadryl, Zyrtec, Claritin) for itching and Tylenol for pain.  Applying ice to the area for 15 minutes or so may also help relieve discomfort. Avoid using aspirin or ibuprofen unless instructed by your physician.    *  Please notify us as soon as possible if you are pregnant. Most of these procedure are contraindicated in pregnancy.    If you have any questions, please call the [facility] at [phone]. We look forward to seeing you soon!    Delfin Gant, RN

## 2022-02-06 ENCOUNTER — Telehealth: Payer: Self-pay | Admitting: Vascular Surgery

## 2022-02-06 NOTE — Telephone Encounter (Signed)
Identified patient using 3x IDs.    Patient has questions in regards to her physical therapy post vein procedure. Please return her call at [phone].

## 2022-02-06 NOTE — Telephone Encounter (Signed)
Spoke to [place], she is concerned patient has to drive long distance since patient still has issues with his gait even after procedure. No wounds, no symptoms.    Ok vv.

## 2022-02-09 ENCOUNTER — Telehealth: Payer: Self-pay | Admitting: Vascular Surgery

## 2022-02-09 DIAGNOSIS — R0989 Other specified symptoms and signs involving the circulatory and respiratory systems: Secondary | ICD-10-CM

## 2022-02-09 NOTE — Telephone Encounter (Signed)
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 Arlyn Dunning on 02/18/2022 at 0800. Please read the following instructions carefully.     Arrive [facility] at least 15 minutes prior to your procedure start time. We are located in the [facility]) building on the second floor, in [address]. Our street address is [place]. Parking is available in the parking structure off [address]. There is on-going construction in the area, so please allow yourself extra time to arrive and park. Bring your parking stub with you.   Stop eating and drinking two hours prior to your procedure start time. If you need to take a medication, you may do so with a sip of water   HYDRATE well the day before; At least 16oz prior to your procedure and 32 oz throughout the day the day prior.   Take your medications as you normally would.   Bring your photo ID and insurance card with you the day of the procedure. For self-pay, you can call the office for an estimate.  Bring your thigh high compression hose with you the day of the procedure. Compression hose should have a rating of 20-30 mmHg unless otherwise instructed. Please purchase on [email or web address] or there are several local businesses that carry thigh high compression hose. Just call if you have any questions. For sclerotherapy, if you know for sure the treatment area is at least 2 inches below the knee, knee-high compression is acceptable.   You must have a ride home . Most procedures take about 2 hours including pre- and post-procedure time. Post procedure you will be elevating for 20 minutes.  Wear loose, comfortable clothes and leave all valuables at home.       For your information:      Post procedure recovery and restrictions:    For the first 7 days after your procedure, the following four  activities are vital to your healing process to ensure that the veins we treated stay closed.     Wear your compression hose and/or compression device at all times for the first 7 days (except when showering).Ideally, you would wear compression whenever possible thereafter to prevent issues with your other veins.     Take two 30 minute walks every day. If you can't walk for 30 minutes at one time, break the 30 minutes into shorter periods of time.    Avoid vigorous exercises such as jogging, high-impact aerobics, sit-ups, leg strength training, squats, or heaving lifting over 10 pounds.    Avoid soaking in hot baths, pools and hot tubs for the first 7 days.    If you will be traveling a long distance by car or plane, please speak to your provider for guidance.    Expect that you may feel hardness along the vein. This could cause pain when pushed on.  A tightening sensation is normal and may last a few days. It is common to see bruising at the site or along the vein. Over the next several weeks your veins may change in texture and color as the body remodels the closed vein. Lumps, bumps and hardness may form and are normal.     Medication and post-operative pain management:     You may take over the counter antihistamines (Benadryl, Zyrtec, Claritin) for itching and Tylenol for pain.  Applying ice to the area for 15 minutes or so may also help relieve discomfort. Avoid using aspirin or ibuprofen unless instructed by your physician.    *Please notify us as soon as possible if you are pregnant. Most of these procedure are contraindicated in pregnancy.    If you have any questions, please call the [facility] at [phone]. We look forward to seeing you soon!    Lavonia Drafts, [place]

## 2022-02-09 NOTE — Telephone Encounter (Signed)
Call and left patient voicemail to call back for Pre Vein instructions. Please transfer call to myself or nursing staff.

## 2022-02-09 NOTE — Telephone Encounter (Signed)
Spoke with patient, IDx3, reviewed pre-procedure instructions. No questions at this time, patient verbalized understanding. Instructed to call clinic if any questions or concerns should arise.

## 2022-02-17 ENCOUNTER — Ambulatory Visit: Payer: Medicare Other

## 2022-02-18 ENCOUNTER — Ambulatory Visit (HOSPITAL_BASED_OUTPATIENT_CLINIC_OR_DEPARTMENT_OTHER): Payer: Medicare Other | Admitting: Vascular Surgery

## 2022-02-18 ENCOUNTER — Ambulatory Visit
Admission: RE | Admit: 2022-02-18 | Discharge: 2022-02-18 | Disposition: A | Discharge status: Home / Self Care | Payer: Medicare Other | Source: Ambulatory Visit | Attending: Vascular Surgery | Admitting: Vascular Surgery

## 2022-02-18 VITALS — BP 144/90 | HR 77 | Temp 97.9°F | Resp 16 | Ht 65.0 in | Wt 148.4 lb

## 2022-02-18 DIAGNOSIS — R0989 Other specified symptoms and signs involving the circulatory and respiratory systems: Secondary | ICD-10-CM

## 2022-02-18 DIAGNOSIS — I83812 Varicose veins of left lower extremities with pain: Secondary | ICD-10-CM

## 2022-02-18 DIAGNOSIS — I872 Venous insufficiency (chronic) (peripheral): Secondary | ICD-10-CM

## 2022-02-18 DIAGNOSIS — R52 Pain, unspecified: Secondary | ICD-10-CM | POA: Insufficient documentation

## 2022-02-18 NOTE — Progress Notes (Signed)
Met and assessed patient in pre procedure area. Consent, NPO status, allergies, and pre-op orders confirmed.   Medical history reviewed.  Heart disease- no       Lung disease-  PE in the pass, in the 90's per patient.   Kidney and/or liver disease-  no   Neurologic disease- no   Diabetes- no    Bleeding disorders- no   Taking blood thinners- no    Visitors were sent to the waiting room. Patient was escorted to procedure room, placed on the procedure table, and attached to monitoring for the duration of the procedure.    [place], [place]   02/18/2022 8:19 AM

## 2022-02-18 NOTE — Progress Notes (Signed)
Procedure complete. The patient had Left leg Varithena  . Patient tolerated the procedure well and is in good spirits. Compression hose  were placed on the treated leg/legs. Patient dangled and dressed without difficulty. Denies dizziness or nausea. Patient ambulated with assist to post-procedure area and is elevating legs for 20 mins . Lori Lewis, [place]

## 2022-02-18 NOTE — Progress Notes (Addendum)
[facility] Pre-Procedure Note    Lori Lewis arrived in clinic today at [phone].    Scheduled today for: left Varithena Therapy  They have been NPO since  02/17/2022 @1500 .    Pre procedure check list  Two forms of identification information verified- Yes     Reviewed allergies and documented changes-Yes    Verified procedure with the patient-.Yes    Patient has arranged for a ride home- Yes    Patient was instructed that if they have any type of anxiolytics they are not to drive or drink alcohol for at least 12-24 hours. Yes    Patient has compression hose. Yes    Belongings are with patient.    Consent signed by- patient     , RN

## 2022-02-18 NOTE — Progress Notes (Signed)
Discharge    The patient elevated for twenty minutes.    Patient has met the following criteria for discharge:    -30 minutes have passed since the last oral sedative, hypnotic or narcotic medication. - Yes  -Patient has an adapted Aldrete score of 11, or equal to baseline. -   Yes  -Pain is controlled and pain score is less than or equal to 4 or has returned to baseline. -  Yes  -There is no significant nausea or vomiting and/or the patient is able to tolerate oral fluid when appropriate. -   Yes  -Bleeding is not observed. -  Yes  -Activity is consistent with baseline. - Yes    Verbal and written discharge instructions for Varithena were given to the patient. Future appointments were reviewed. All questions from the patient were answered and they expressed understanding of the conversation.  Patient was discharged home with belongings. Lorie Apley, [place]

## 2022-02-18 NOTE — Progress Notes (Signed)
Vein Procedure Note  Procedure Setting: Hospital outpatient  Pre-operative diagnosis: Symptomatic superficial venous reflux and Varicose veins with complication  Post-operative diagnosis: Same  Surgeon: Louretta Parma  Date: 02/18/2022  Procedure/Impression: Successful Endovenous chemical ablation of left anterior accessory saphenous vein and superficial varicosities of the thigh with ultrasound guidance     Veins and treatment:   Vein 1: Endovenous non-compound microfoam ablation of left anterior accessory saphenous vein and superficial varicosities of the left thigh  Side: left  Amount of Varithena 1% : 1mL       Intra-procedural anticoagulation: None  Sedation: None  Type of anesthesia: Local with 1% lidocaine  Complications: None  Specimen: None  Estimated blood loss: Negligible     Consent was confirmed.  Pre-procedural pause completed.  The extremity was prepped with chlorhexidine gluconate skin prep and sterile drapes were applied. The skin was anesthetized with a small intradermal injection of 1% lidocaine at the chosen puncture site overlying the left anterior accessory saphenous vein in the distal thigh. A micro-puncture needle followed by passage of a 0.018 guidewire through the puncture needle and a 4 French sheath dilator was inserted over the guidewire. The wire was removed and the dilator was flushed with saline solution. The lower extremity was then elevated  to 45 degrees while maintaining sterility of the drapes overlying the elevated leg. The Varithena canister was activated and the initial foam generated was discarded. A 5 mL aliquot of Varithena foam in a sterile syringe was then attached to the vascular catheter. Injection of the Varithena was done at 1/2 to 1 cc per second with observation by ultrasound of the central 5 cm of the anterior accessory saphenous vein. When the Varithena foam arrived to approximately 3-5 cm peripheral to the saphenofemoral junction, the anterior accessory saphenous vein  was then compressed to limit flow of foam into the common femoral vein for two minutes. Once spasm was confirmed throughout the treated segment of vein, additional injection of 4 cc of Varithena was performed through the vascular catheter with compression of the saphenous vein central to this to direct the flow of Varithena in a retrograde direction into the varicosities of the thigh     Repeat ultrasound demonstrated some patent large tortuous varicosities. One was accessed with a 25 gauge needle butterfly needle using ultrasound guidance. Once the tubing of the infusion set was filled with venous blood, a total of 4 cc of Varithena was then generated to fill a syringe and was then injected through the winged infusion set into the varicose vein to fill the varicosity and multiple adjacent varicosities with foam with ultrasound guidance and manipulation of the ultrasound probe to assist with evacuation of blood from target veins to improve vein filling with foam. The leg was again elevated and compression held at the saphenofemoral junction for 2 minutes. Once appropriate spasm had been confirmed in the treated veins, the winged infusion needle and the vascular catheter were removed from the leg and light pressure was applied over the puncture sites for hemostasis.     Repeat ultrasound demonstrated successful foam injection into all affected veins. Therefore phlebectomy was not deemed to be necessary.    The left common femoral vein were then evaluated for flow and compressibility prior to dressing placement. The leg was cleaned and thigh high compression hose was applied.     The patient tolerated the procedure well without any adverse events. The patient was given instructions and was ambulated for 30 minutes under supervision.  See the nurse's notes for post-procedure treatment of the leg(s) and ambulation.    Follow up plan:  Discharge instructions provided to the patient. Return to clinic for venous duplex  in 7 days.    Patient to use compression therapy using stockings with a pressure of  20-30 mmHg for 24 hours a day for 7 days post-procedure.      Arlyn Dunning, MD  Assistant Professor  Department of Surgery  Division of Vascular Surgery  [facility]: [phone]   Pager: [phone]

## 2022-02-18 NOTE — Patient Instructions (Signed)
Procedure performed:  Ultrasound guided endovenous chemical ablation (Varithena) of left anterior accessory saphenous vein and superficial varicosities of the left thigh

## 2022-02-18 NOTE — Patient Instructions (Signed)
Discharge Instructions for Varithena with Dr. Luci Bank    Keep your compression hose on for 24 hours. When you take your hose off, there may be a gauze dressings on your leg. They can be removed. Under that could be pieces of tape called Steri-strips or glue called Dermabond. Leave them in place until they fall off on their own. They can get wet, pat dry, and do not apply anything over them.   If something other than compression hose were used to support the circulation, follow the verbal instructions that were given to you by the clinic staff. After the first week, you should continue to wear your compression stockings to prevent issues with your other veins. If you will be traveling a long distance by car or plane, please speak to your provider for guidance.     For the next 7 days, the following four activities are vital to your healing process to ensure that the veins we treated stay closed.     Wear your compression hose and/or compression device at all times for the first 7 days (except when showering).    Take two 30 minute walks every day. If you can't walk for 30 minutes at one time, break the 30 minutes into shorter periods of time.    Avoid vigorous exercises such as jogging, high-impact aerobics, sit-ups, leg strength training, squats, or heaving lifting over 10 pounds.    Avoid soaking in hot baths, pools and hot tubs for the first 7 days.      Expect that you may feel hardness along the vein. This could cause pain when pushed on.  A tightening sensation is normal and may last a few days. It is common to see bruising at the site or along the vein. Over the next several weeks your veins may change in texture and color as the body remodels the closed vein. Lumps, bumps and hardness may form and are normal.     Medication and post-operative pain management:     You may take over the counter antihistamine (Benadryl, Zyrtec, Claritin) for itching and Tylenol for pain. Follow the instructions on the box.  Applying ice to the area for 15 minutes or so may also help relieve discomfort. Avoid using aspirin or ibuprofen unless instructed by your physician. If you have further concerns about your pain, please contact your provider.      Follow up:   Your future appointments are listed on the first page of this document.    Concerns:  You could have bleeding. If this happens, it could be a lot. Don't be alarmed. It is from a vein, not an artery.  Lie down, elevate your leg above your heart and hold pressure over the bleeding area. It should stop in 10-15 minutes.     If you have any of the following symptoms after business hours, go to the emergency room or urgent care clinic right away.    - Severe bleeding, swelling, pain  - Rash or blisters on the leg  - Fever greater than 100 degrees   - A lump the size of a golf ball  - Area that is swollen, red, pain and warm  - The treated extremity is blue, numb, tingling or extremely cold  - Sudden shortness of breath, chest pain please call 911 immediately       Call us Monday-Friday, 8 AM to 5 PM at [phone]. If after hours,  call the hospital operator at [phone] and ask for  the Vascular Surgeon on call to be paged.    For non-emergent questions, you have the option to send a message via MyChart and can attach pictures, if needed. We will try to respond in 1-3 days.

## 2022-02-23 ENCOUNTER — Ambulatory Visit
Admission: RE | Admit: 2022-02-23 | Discharge: 2022-02-23 | Disposition: A | Discharge status: Home / Self Care | Payer: Medicare Other | Source: Ambulatory Visit | Attending: Vascular Surgery | Admitting: Vascular Surgery

## 2022-02-23 ENCOUNTER — Ambulatory Visit (HOSPITAL_BASED_OUTPATIENT_CLINIC_OR_DEPARTMENT_OTHER): Payer: Medicare Other

## 2022-02-23 DIAGNOSIS — Z09 Encounter for follow-up examination after completed treatment for conditions other than malignant neoplasm: Secondary | ICD-10-CM

## 2022-02-23 DIAGNOSIS — R52 Pain, unspecified: Secondary | ICD-10-CM | POA: Insufficient documentation

## 2022-02-23 DIAGNOSIS — R0989 Other specified symptoms and signs involving the circulatory and respiratory systems: Secondary | ICD-10-CM | POA: Insufficient documentation

## 2022-02-23 DIAGNOSIS — I82412 Acute embolism and thrombosis of left femoral vein: Secondary | ICD-10-CM

## 2022-02-23 DIAGNOSIS — Z7901 Long term (current) use of anticoagulants: Secondary | ICD-10-CM

## 2022-02-23 DIAGNOSIS — I82562 Chronic embolism and thrombosis of left calf muscular vein: Secondary | ICD-10-CM

## 2022-02-23 DIAGNOSIS — I82812 Embolism and thrombosis of superficial veins of left lower extremities: Secondary | ICD-10-CM

## 2022-02-23 MED ORDER — ELIQUIS 5 MG TABLET
5.0000 mg | ORAL_TABLET | Freq: Two times a day (BID) | ORAL | 2 refills | Status: DC
Start: 2022-02-23 — End: 2022-03-04

## 2022-02-23 NOTE — Progress Notes (Signed)
Reviewed duplex with Dr. Louretta Parma. Patient advised to be on anticoagulation and follow up in 1-2 weeks with duplex prior. Explained to the patient the reasoning for anticoagulation and Dr. Louretta Parma being conservative with the treatment. Instructed patient to notify us if swelling increases, if she has increased pain, discoloration, or changes of sensation to lower extremity. ER precautions given if patient experiences shortness of breath or chest pain.

## 2022-02-23 NOTE — Progress Notes (Signed)
Entered in error

## 2022-02-23 NOTE — Progress Notes (Signed)
Patient came in for their post procedure nurse visit. This patient had varithena on 02/18/22 with Dr. Louretta Parma.      The following list was reviewed with the patient.    The patient has had their duplex ultra sound of the treated site.-  yes   Lower extremities were examined.-  yes  The puncture sites remain intact.-  yes  The patient rated their pain 2 out of a scale of 1-10.  The patient is not taking anything for pain.   The patient continues to wear their compression hose.-  yes  The patient is taking thirty minute walks twice a day.-  yes  The patient expressed no concerns regarding their treatment.-  yes  They are scheduled for a follow up appointment.-  yes        The patient was informed of follow up care and expressed understanding of our discussion. The patient was discharged home with their compression hose on.

## 2022-03-04 ENCOUNTER — Ambulatory Visit (HOSPITAL_BASED_OUTPATIENT_CLINIC_OR_DEPARTMENT_OTHER): Payer: Medicare Other | Admitting: Vascular Surgery

## 2022-03-04 ENCOUNTER — Encounter: Payer: Self-pay | Admitting: Vascular Surgery

## 2022-03-04 ENCOUNTER — Ambulatory Visit
Admission: RE | Admit: 2022-03-04 | Discharge: 2022-03-04 | Disposition: A | Discharge status: Home / Self Care | Payer: Medicare Other | Source: Ambulatory Visit | Attending: Vascular Surgery | Admitting: Vascular Surgery

## 2022-03-04 VITALS — BP 131/96 | HR 79 | Temp 97.6°F | Resp 16

## 2022-03-04 DIAGNOSIS — Z86718 Personal history of other venous thrombosis and embolism: Secondary | ICD-10-CM | POA: Insufficient documentation

## 2022-03-04 DIAGNOSIS — R0989 Other specified symptoms and signs involving the circulatory and respiratory systems: Secondary | ICD-10-CM | POA: Insufficient documentation

## 2022-03-04 DIAGNOSIS — I82412 Acute embolism and thrombosis of left femoral vein: Secondary | ICD-10-CM

## 2022-03-04 DIAGNOSIS — I82562 Chronic embolism and thrombosis of left calf muscular vein: Secondary | ICD-10-CM | POA: Insufficient documentation

## 2022-03-04 DIAGNOSIS — I83812 Varicose veins of left lower extremities with pain: Secondary | ICD-10-CM | POA: Insufficient documentation

## 2022-03-04 DIAGNOSIS — Z09 Encounter for follow-up examination after completed treatment for conditions other than malignant neoplasm: Secondary | ICD-10-CM | POA: Insufficient documentation

## 2022-03-04 DIAGNOSIS — R52 Pain, unspecified: Secondary | ICD-10-CM | POA: Insufficient documentation

## 2022-03-04 DIAGNOSIS — I82502 Chronic embolism and thrombosis of unspecified deep veins of left lower extremity: Secondary | ICD-10-CM

## 2022-03-04 NOTE — Progress Notes (Signed)
Vascular Surgery Attending Addendum:    The patient was seen and evaluated with the resident, Dr. Mammie Russian, who has documented the history, imaging, laboratory, and examination findings. I personally examined the patient and independently confirmed the above documented history. I personally and independently interpreted the imaging and laboratory work. The assessment and plan were developed together.    Briefly, the patient with a history of symptomatic varicose veins of the left lower extremity for which she is status post Varithena endovenous chemical ablation of the left anterior accessory saphenous vein and superficial thigh varicosities under ultrasound guidance.  At her last clinic visit a week and a half ago, she had undergone a follow up duplex which demonstrated extension of thrombus from the great saphenous vein into the common femoral vein.  This was nonocclusive but given the extent of this extension (approximate 50%), the patient was prescribed a course of anticoagulation.  She now returns for repeat surveillance duplex as well as follow-up.  Overall, she is doing quite well.  Her left lower extremity pain has significantly improved and she no longer has the "lightening strike" pains that she had with some of her lower extremity varicosities.  She does continue to wear her compression stockings as prescribed and is ambulating daily.  She has been taking her anticoagulation without any complications.    On my exam she has some ecchymoses overlying areas of prior varicosities but no erythema, induration, or drainage.  There is no tenderness to palpation throughout her left lower extremity..      She did undergo a repeat duplex today.  This demonstrates no evidence of thrombus in the left common femoral vein.  The left deep femoral, femoral, popliteal veins appear patent.  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    At this point, her anticoagulation can be  discontinued as her deep venous extension has resolved.  The plan is to have her follow up as needed.  If her left lower extremity symptoms recur or her right lower extremity symptoms worsen, I have advised her to return to the vascular surgery clinic for re-evaluation and repeat imaging to see if there are any new targets for treatment.  She should continue best medical therapy, which includes continued exercise, elevation, and consistent compression stocking use. I counseled the patient about the plan of care and addressed any outstanding questions and concerns. There were no barriers to learning and the patient indicated understanding of the plan of care.     Luci Bank, MD  Assistant Professor  Department of Surgery  Division of Vascular Surgery   [facility]

## 2022-03-04 NOTE — Nursing Note (Signed)
Patient identified by name and DOB, vitals taken and documented in patient chart.    Jaxson Keener, MA

## 2022-03-04 NOTE — Progress Notes (Signed)
Vascular Surgery Clinic Progress Note    Date of Service: 03/04/2022  Attending: Luci Bank, MD [phone]    Chief Complaint: Follow up for venous reflux.    History of Present Illness:  Lori Lewis is a 76yr female presenting to the clinic today for follow up s/p successful Endovenous chemical ablation of left anterior accessory saphenous vein and superficial varicosities of the thigh on 02/18/22. Post ablation venous duplex (02/23/22) showed acute thrombosis of the accessory saphenous veins that extends into common femoral vein, patient was conservatively started on Eliquis and scheduled for follow up venous duplex prior to today's clinic visit to monitor progression of thrombosis.  Today's venous duplex shows resolution of thrombosis seen on 02/23/2022 venous duplex.    Patient reports great improvement in left leg pain in left calf swelling since her ablation. She expresses satisfaction with undergoing the ablation. She has been walking for 1 hour a day and wears her compression stockings for about 80% of a 24 hour, she is unable to wear compression stockings at night because they are too uncomfortable and itchy.  She has been compliant with the Eliquis, however she reports new onset nightly headaches and a 3 day period of stomach pain that she relates to the Eliquis.      Vascular Surgery History:  2023 - Successful Endovenous chemical ablation of left anterior accessory saphenous vein and superficial varicosities of the thigh with ultrasound guidance  2002 - stripping and ligation of unspecified veins in BLE  2002 - PR Remove Pul Art Emboli W CP Bypass      Anti-platelet: aspirin  Anticoagulation: Eliquis  Statin: none    Current Outpatient Medications   Medication Instructions    Aspirin 81 mg, ORAL, DAILY MORNING    Epinephrine (Anaphylaxis) (EPIPEN) 0.3 mg, IM, PRN    Estradiol (ESTRACE) 0.5 g, TOPICAL, 3X WEEKLY (OUTPATIENT)    Hydrocortisone (ANUSOL-HC) 2.5 % Cream 1 applicator, TOPICAL, THREE TIMES  DAILY PRN    LevoTHYROxine 88 mcg Tablet 1 tablet, ORAL, DAILY (OUTPATIENT)       Physical Exam:  Temp: 36.4 C (97.6 F) (12/13 1455)  Temp src: Temporal (12/13 1455)  Pulse: 79 (12/13 1455)  BP: 131/96 (12/13 1457)  Resp: 16 (12/13 1455)  SpO2: 100 % (12/13 1455)  Height: --  Weight: --    Physical exam:  General appearance:  Appears stated age, comfortable and not in acute distress  CV: Regular rate and rhythm  Pulm: breathing comfortably on RA  Neuro: Alert and oriented  Extremities:  Left thigh with some ecchymosis over prior varicosities without surrounding erythema.  Mildly tender to palpation.    Vascular Exam:  RIGHT  PT: Palpable  DP: Palpable    LEFT  PT: Palpable  DP: Palpable    Imaging:  VL VENOUS REFLUX EVAL, BILATERAL was independently viewed and interpreted by Luci Bank, MD [phone] and reviewed with the patient.    VL VENOUS DUPLEX, LOWER EXTREMITY, LEFT    Result Date: 03/04/2022  NARRATIVE FROM IMAGING REPORT: Department: [facility] Patient: [id number] Lori [facility], BELLAROSE) Account Number: [id number] CPT Code: 60630 ICD-9: ; Referring Physician: Luci Bank, MD  Indication: Patient presents for follow up with thrombus extending into common femoral vein s/p Varithena procedure on 02/18/22.  Please evaluate for residual deep vein thrombosis.    LEFT: The common femoral, profunda femoral, femoral, popliteal, posterior tibial and peroneal veins are evaluated in their entirety. Complete vein wall compressions are obtained throughout.  There is no evidence  of intraluminal echoes.  **Chronic appearing, non-occluding intraluminal echoes are identified in two gastrocnemius veins in the proximal calf and appear to extend into one of the paired popliteal veins.  Complete vein wall compressions are obtained from the remaining deep veins with no evidence evidence  of intraluminal echoes.  The posterior tibial and peroneal veins are patent with Color Doppler.  Color flow and spectral Doppler demonstrate  spontaneous and phasic flow with brisk responses to augmentation maneuvers.  Representative waveforms are obtained from the common femoral, femoral and popliteal veins.   IMPRESSION: THIS IS A PRELIMINARY REPORT, PHYSICIAN REVIEW AND INTERPRETATION PENDING End of Report          Assessment:  Lori Lewis is a 89yr female with a history of LLE DVT and PE as well as bilateral lower extremity varicose veins presenting to the clinic today for follow up s/p successful Endovenous chemical ablation of left anterior accessory saphenous vein and superficial varicosities of the thigh  on 02/18/22.  Post ablation venous duplex on 02/23/2022 showed acute thrombosis of the accessory saphenous veins that extends into common femoral vein, patient was conservatively started on Eliquis.  Today her follow-up venous duplex shows resolution of the thrombus and we will plan to discontinue treatment with Eliquis. Explained to patient at she can follow-up with Dr. Louretta Parma on an as-needed basis if symptoms arise again in the future.          Plan:  - no need for further follow-up at this time  - please call the clinic if any new symptoms arise and schedule an appointment with Dr. Louretta Parma  - continue use of compression socks, elevation, exercise  -call the clinic if any concerning symptoms arise      All questions were answered and the patient verbalized her understanding and agreed with the above plan.         The patient was seen and examined with Dr. Luci Bank, MD [phone], vascular surgery attending of record, who is in agreement with this assessment and plan.       Julian Reil, MD  PGY1 - General Surgery  PI #: [id number]  Vascular pager [phone]      Vascular Surgery Attending Addendum:    The patient was seen and evaluated with the resident, Dr. Mammie Russian, who has documented the history, imaging, laboratory, and examination findings. I personally examined the patient and independently confirmed the above documented history. I personally  and independently interpreted the imaging and laboratory work. The assessment and plan were developed together.    Briefly, the patient with a history of symptomatic varicose veins of the left lower extremity for which she is status post Varithena endovenous chemical ablation of the left anterior accessory saphenous vein and superficial thigh varicosities under ultrasound guidance.  At her last clinic visit a week and a half ago, she had undergone a follow up duplex which demonstrated extension of thrombus from the great saphenous vein into the common femoral vein.  This was nonocclusive but given the extent of this extension (approximate 50%), the patient was prescribed a course of anticoagulation.  She now returns for repeat surveillance duplex as well as follow-up.  Overall, she is doing quite well.  Her left lower extremity pain has significantly improved and she no longer has the "lightening strike" pains that she had with some of her lower extremity varicosities.  She does continue to wear her compression stockings as prescribed and is ambulating daily.  She has been taking her anticoagulation  without any complications.    On my exam she has some ecchymoses overlying areas of prior varicosities but no erythema, induration, or drainage.  There is no tenderness to palpation throughout her left lower extremity..      She did undergo a repeat duplex today.  This demonstrates no evidence of thrombus in the left common femoral vein.  The left deep femoral, femoral, popliteal veins appear patent.  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    At this point, her anticoagulation can be discontinued as her deep venous extension has resolved.  The plan is to have her follow up as needed.  If her left lower extremity symptoms recur or her right lower extremity symptoms worsen, I have advised her to return to the vascular surgery clinic for re-evaluation and repeat imaging to  see if there are any new targets for treatment.  She should continue best medical therapy, which includes continued exercise, elevation, and consistent compression stocking use. I counseled the patient about the plan of care and addressed any outstanding questions and concerns. There were no barriers to learning and the patient indicated understanding of the plan of care.     Luci Bank, MD  Assistant Professor  Department of Surgery  Division of Vascular Surgery  [facility]

## 2022-03-09 ENCOUNTER — Ambulatory Visit: Payer: Medicare Other | Admitting: Ophthalmology

## 2022-05-24 NOTE — Progress Notes (Deleted)
[facility]  Comprehensive Ophthalmology    Referring/Consulting Provider: Tresa Garter, MD  [address] [place] [address]      History of Present Illness: Lori Lewis is a 77yrfemale here for chief complaint of cataracts.  LV 08/2021.      Past Ocular History:   Stargardt disease  Dry eyes  Cataracts    Ocular Medications:  none    Past Medical History:  Past Medical History:   Diagnosis Date    Atrophic vaginitis     BPPV (benign paroxysmal positional vertigo)     DVT (deep venous thrombosis)     LLE. 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; legally blind since age 77yo   Urge incontinence     Varicose veins of both lower extremities      Hypothyroidism      Family Ocular History:  Glaucoma (sister)      Allergies:    Nettle    Anaphylaxis  Latex    Rash  Penicillin    Unknown-Explain in Comments  Sulfacetamide Sodium    Other-Reaction in Comments  Penicillins    Rash and Other-Reaction in Comments    Comment:Has patient had a PCN reaction causing immediate             rash, facial/tongue/throat swelling, SOB or             lightheadedness with hypotension: YesHas patient             had a PCN reaction causing severe rash involving             mucus membranes or skin necrosis: NoHas patient             had a PCN reaction that required hospitalization:             NoHas patient had a PCN reaction occurring within             the last 10 years: UnknownIf all of the above             answers are "NO", then may proceed with             Cephalosporin use.Has patient had a PCN reaction             causing immediate rash, facial/tongue/throat             swelling, SOB or  lightheadedness with hypotension:             UnknownHas patient had a PCN reaction causing             severe rash involving mucus membranes or skin             necrosis: UnknownHas patient had a PCN reaction             that required hospitalization: UnknownHas patient             had a PCN reaction occurring within the last 10  years: UnknownIf all of the above answers are             "NO", then may proceed with Cephalosporin use.  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 Melida Quitter for evaluation      2.  Cataracts OU  Unclear if recent subjective decline in vision related to Stargardt vs cataracts.   Discussed that surgery will likely not significantly improve vision.  However, I offered the option for cataract surgery with the understanding that this may help allow more light into eyes though visual prognosis is guarded.   She elects to monitor cataracts for now and will reassess in 6 months.       Refer to Dr. Melida Quitter for evaluation, sooner prn  Follow up 6 mos DFE    Retired Engineer, materials  Letter to [place]    Patient was seen and examined with the resident. I agree with the resident's findings except for any changes as documented in my note.      Patriciaann Clan, MD  Assistant Professor of Ophthalmology  [facility]  Pager: [phone]

## 2022-05-25 ENCOUNTER — Ambulatory Visit: Payer: Medicare Other | Admitting: Ophthalmology

## 2023-12-06 ENCOUNTER — Ambulatory Visit: Admit: 2023-12-06 | Discharge: 2023-12-06 | Disposition: A | Payer: Self-pay

## 2024-02-23 ENCOUNTER — Telehealth: Payer: Self-pay | Admitting: Vascular Surgery

## 2024-02-23 ENCOUNTER — Other Ambulatory Visit: Payer: Self-pay | Admitting: Vascular Surgery

## 2024-02-23 DIAGNOSIS — R0989 Other specified symptoms and signs involving the circulatory and respiratory systems: Secondary | ICD-10-CM

## 2024-02-23 DIAGNOSIS — I82412 Acute embolism and thrombosis of left femoral vein: Secondary | ICD-10-CM

## 2024-02-23 NOTE — Progress Notes (Deleted)
 This patient will need reflux imaging prior to an appointment. Will ask clerical staff to schedule both appointments.

## 2024-02-23 NOTE — Telephone Encounter (Signed)
 This patient will need reflux imaging prior to an appointment. Will ask clerical staff to schedule both appointments.

## 2024-02-23 NOTE — Progress Notes (Signed)
 Opened in error

## 2024-02-23 NOTE — Telephone Encounter (Signed)
 Incoming call verified using x3 identifiers. Per patient she feels like there are knots in her thighs and leg swollen. She would like to see Dr Marshia again because she is the best doctor to help her with that. She saw her PCP at [facility] but they didn't help her and she will need a higher level of care. Please advise if imaging is needed prior to appt.     Aneita Aye   Patient Service Rep III  Vascular Clinic  [address]  [place], [place] [address]  [phone]  [phone] Fax

## 2024-02-28 NOTE — Telephone Encounter (Signed)
 Outgoing call to patient to schedule VL and follow up w/Dr Marshia. Per patient she had a VL done at [facility] in [place], she is unsure if its the same one. Please request for images. Informed patient that once received if Dr Marshia would like VL done, patient will be contacted to schedule. She acknowledged understanding.     [facility] [place]  [address]  [place], [place]    Aneita Aye   Patient Service Rep III  Vascular Clinic  [address]  [place], [place] [address]  [phone]  [phone] Fax

## 2024-03-02 ENCOUNTER — Telehealth: Payer: Self-pay | Admitting: Vascular Surgery

## 2024-03-02 ENCOUNTER — Other Ambulatory Visit: Payer: Self-pay | Admitting: Vascular Surgery

## 2024-03-02 NOTE — Telephone Encounter (Signed)
 0915/2025 images is received and sent for archive

## 2024-03-02 NOTE — Telephone Encounter (Signed)
 LVM for patient to schedule VL and follow up w/Dr Marshia.     Aneita Aye   Patient Service Rep III  Vascular Clinic  [address]  [place], [place] [address]  [phone]  [phone] Fax

## 2024-05-31 ENCOUNTER — Ambulatory Visit

## 2024-05-31 ENCOUNTER — Ambulatory Visit: Admitting: Vascular Surgery
